# Patient Record
Sex: Female | Born: 1950 | Race: White | Hispanic: No | Marital: Married | Smoking: Never smoker
Health system: Southern US, Community
[De-identification: ages and names within clinical notes are randomized; demographics above are authoritative.]

## PROBLEM LIST (undated history)

## (undated) DIAGNOSIS — T4145XA Adverse effect of unspecified anesthetic, initial encounter: Secondary | ICD-10-CM

## (undated) DIAGNOSIS — C50919 Malignant neoplasm of unspecified site of unspecified female breast: Secondary | ICD-10-CM

## (undated) DIAGNOSIS — T8859XA Other complications of anesthesia, initial encounter: Secondary | ICD-10-CM

## (undated) DIAGNOSIS — R112 Nausea with vomiting, unspecified: Secondary | ICD-10-CM

## (undated) DIAGNOSIS — G4730 Sleep apnea, unspecified: Secondary | ICD-10-CM

## (undated) DIAGNOSIS — IMO0001 Reserved for inherently not codable concepts without codable children: Secondary | ICD-10-CM

## (undated) DIAGNOSIS — I10 Essential (primary) hypertension: Secondary | ICD-10-CM

## (undated) DIAGNOSIS — Z923 Personal history of irradiation: Secondary | ICD-10-CM

## (undated) DIAGNOSIS — M1990 Unspecified osteoarthritis, unspecified site: Secondary | ICD-10-CM

## (undated) DIAGNOSIS — R51 Headache: Secondary | ICD-10-CM

## (undated) DIAGNOSIS — R519 Headache, unspecified: Secondary | ICD-10-CM

## (undated) DIAGNOSIS — Z9889 Other specified postprocedural states: Secondary | ICD-10-CM

## (undated) HISTORY — DX: Unspecified osteoarthritis, unspecified site: M19.90

## (undated) HISTORY — PX: COLONOSCOPY W/ POLYPECTOMY: SHX1380

## (undated) HISTORY — DX: Essential (primary) hypertension: I10

## (undated) HISTORY — PX: APPENDECTOMY: SHX54

## (undated) HISTORY — PX: BREAST SURGERY: SHX581

## (undated) HISTORY — DX: Sleep apnea, unspecified: G47.30

## (undated) HISTORY — PX: JOINT REPLACEMENT: SHX530

## (undated) HISTORY — PX: OTHER SURGICAL HISTORY: SHX169

---

## 1999-07-19 ENCOUNTER — Other Ambulatory Visit: Admission: RE | Admit: 1999-07-19 | Discharge: 1999-07-19 | Payer: Self-pay | Admitting: Obstetrics and Gynecology

## 2000-09-30 ENCOUNTER — Other Ambulatory Visit: Admission: RE | Admit: 2000-09-30 | Discharge: 2000-09-30 | Payer: Self-pay | Admitting: Obstetrics and Gynecology

## 2001-10-13 ENCOUNTER — Other Ambulatory Visit: Admission: RE | Admit: 2001-10-13 | Discharge: 2001-10-13 | Payer: Self-pay | Admitting: Obstetrics and Gynecology

## 2002-10-22 ENCOUNTER — Emergency Department (HOSPITAL_COMMUNITY): Admission: EM | Admit: 2002-10-22 | Discharge: 2002-10-22 | Payer: Self-pay | Admitting: Emergency Medicine

## 2002-10-22 ENCOUNTER — Encounter: Payer: Self-pay | Admitting: Emergency Medicine

## 2003-08-29 ENCOUNTER — Other Ambulatory Visit: Admission: RE | Admit: 2003-08-29 | Discharge: 2003-08-29 | Payer: Self-pay | Admitting: Obstetrics and Gynecology

## 2005-04-26 ENCOUNTER — Encounter: Admission: RE | Admit: 2005-04-26 | Discharge: 2005-04-26 | Payer: Self-pay | Admitting: Family Medicine

## 2006-12-17 ENCOUNTER — Encounter: Admission: RE | Admit: 2006-12-17 | Discharge: 2006-12-17 | Payer: Self-pay | Admitting: Family Medicine

## 2007-12-09 ENCOUNTER — Other Ambulatory Visit: Admission: RE | Admit: 2007-12-09 | Discharge: 2007-12-09 | Payer: Self-pay | Admitting: Family Medicine

## 2009-06-13 ENCOUNTER — Other Ambulatory Visit: Admission: RE | Admit: 2009-06-13 | Discharge: 2009-06-13 | Payer: Self-pay | Admitting: Family Medicine

## 2010-07-03 ENCOUNTER — Other Ambulatory Visit: Admission: RE | Admit: 2010-07-03 | Discharge: 2010-07-03 | Payer: Self-pay | Admitting: Family Medicine

## 2013-01-13 ENCOUNTER — Other Ambulatory Visit: Payer: Self-pay | Admitting: Radiology

## 2013-01-13 DIAGNOSIS — C50919 Malignant neoplasm of unspecified site of unspecified female breast: Secondary | ICD-10-CM

## 2013-01-13 HISTORY — DX: Malignant neoplasm of unspecified site of unspecified female breast: C50.919

## 2013-01-14 ENCOUNTER — Other Ambulatory Visit: Payer: Self-pay | Admitting: Radiology

## 2013-01-14 DIAGNOSIS — C50911 Malignant neoplasm of unspecified site of right female breast: Secondary | ICD-10-CM

## 2013-01-18 ENCOUNTER — Telehealth: Payer: Self-pay | Admitting: *Deleted

## 2013-01-18 DIAGNOSIS — C50411 Malignant neoplasm of upper-outer quadrant of right female breast: Secondary | ICD-10-CM

## 2013-01-18 DIAGNOSIS — C50419 Malignant neoplasm of upper-outer quadrant of unspecified female breast: Secondary | ICD-10-CM | POA: Insufficient documentation

## 2013-01-18 NOTE — Telephone Encounter (Signed)
Confirmed [facility] for 01/27/13 at 1200.  Instructions and contact information given.

## 2013-01-25 ENCOUNTER — Ambulatory Visit
Admission: RE | Admit: 2013-01-25 | Discharge: 2013-01-25 | Disposition: A | Discharge status: Home / Self Care | Payer: BC Managed Care – PPO | Source: Ambulatory Visit | Attending: Radiology | Admitting: Radiology

## 2013-01-25 DIAGNOSIS — C50911 Malignant neoplasm of unspecified site of right female breast: Secondary | ICD-10-CM

## 2013-01-25 MED ORDER — GADOBENATE DIMEGLUMINE 529 MG/ML IV SOLN
20.0000 mL | Freq: Once | INTRAVENOUS | Status: AC | PRN
  Administered 2013-01-25: 20 mL via INTRAVENOUS

## 2013-01-27 ENCOUNTER — Encounter (INDEPENDENT_AMBULATORY_CARE_PROVIDER_SITE_OTHER): Payer: Self-pay | Admitting: General Surgery

## 2013-01-27 ENCOUNTER — Ambulatory Visit (HOSPITAL_BASED_OUTPATIENT_CLINIC_OR_DEPARTMENT_OTHER): Payer: BC Managed Care – PPO | Admitting: General Surgery

## 2013-01-27 ENCOUNTER — Ambulatory Visit: Payer: BC Managed Care – PPO | Attending: General Surgery | Admitting: Physical Therapy

## 2013-01-27 ENCOUNTER — Telehealth: Payer: Self-pay | Admitting: *Deleted

## 2013-01-27 ENCOUNTER — Ambulatory Visit (HOSPITAL_BASED_OUTPATIENT_CLINIC_OR_DEPARTMENT_OTHER): Payer: BC Managed Care – PPO | Admitting: Oncology

## 2013-01-27 ENCOUNTER — Other Ambulatory Visit (HOSPITAL_BASED_OUTPATIENT_CLINIC_OR_DEPARTMENT_OTHER): Payer: BC Managed Care – PPO

## 2013-01-27 ENCOUNTER — Encounter: Payer: Self-pay | Admitting: Oncology

## 2013-01-27 ENCOUNTER — Ambulatory Visit
Admission: RE | Admit: 2013-01-27 | Discharge: 2013-01-27 | Disposition: A | Discharge status: Home / Self Care | Payer: BC Managed Care – PPO | Source: Ambulatory Visit | Attending: Radiation Oncology | Admitting: Radiation Oncology

## 2013-01-27 ENCOUNTER — Encounter: Payer: Self-pay | Admitting: *Deleted

## 2013-01-27 ENCOUNTER — Ambulatory Visit: Payer: BC Managed Care – PPO

## 2013-01-27 VITALS — BP 170/81 | HR 91 | Temp 98.4°F | Resp 20 | Ht 67.0 in | Wt 225.9 lb

## 2013-01-27 DIAGNOSIS — C50411 Malignant neoplasm of upper-outer quadrant of right female breast: Secondary | ICD-10-CM

## 2013-01-27 DIAGNOSIS — C50419 Malignant neoplasm of upper-outer quadrant of unspecified female breast: Secondary | ICD-10-CM

## 2013-01-27 DIAGNOSIS — C50919 Malignant neoplasm of unspecified site of unspecified female breast: Secondary | ICD-10-CM | POA: Insufficient documentation

## 2013-01-27 DIAGNOSIS — IMO0001 Reserved for inherently not codable concepts without codable children: Secondary | ICD-10-CM | POA: Insufficient documentation

## 2013-01-27 DIAGNOSIS — M19049 Primary osteoarthritis, unspecified hand: Secondary | ICD-10-CM | POA: Insufficient documentation

## 2013-01-27 DIAGNOSIS — Z01818 Encounter for other preprocedural examination: Secondary | ICD-10-CM | POA: Insufficient documentation

## 2013-01-27 DIAGNOSIS — M1710 Unilateral primary osteoarthritis, unspecified knee: Secondary | ICD-10-CM | POA: Insufficient documentation

## 2013-01-27 DIAGNOSIS — R293 Abnormal posture: Secondary | ICD-10-CM | POA: Insufficient documentation

## 2013-01-27 DIAGNOSIS — IMO0002 Reserved for concepts with insufficient information to code with codable children: Secondary | ICD-10-CM | POA: Insufficient documentation

## 2013-01-27 LAB — COMPREHENSIVE METABOLIC PANEL (CC13)
ALT: 39 U/L (ref 0–55)
AST: 26 U/L (ref 5–34)
Alkaline Phosphatase: 115 U/L (ref 40–150)
BUN: 14.2 mg/dL (ref 7.0–26.0)
Calcium: 11.3 mg/dL — ABNORMAL HIGH (ref 8.4–10.4)
Chloride: 105 mEq/L (ref 98–107)
Creatinine: 0.8 mg/dL (ref 0.6–1.1)
Total Bilirubin: 0.47 mg/dL (ref 0.20–1.20)

## 2013-01-27 LAB — CBC WITH DIFFERENTIAL/PLATELET
BASO%: 0.5 % (ref 0.0–2.0)
Basophils Absolute: 0 10*3/uL (ref 0.0–0.1)
EOS%: 2.1 % (ref 0.0–7.0)
HCT: 41.8 % (ref 34.8–46.6)
HGB: 14.3 g/dL (ref 11.6–15.9)
LYMPH%: 29.5 % (ref 14.0–49.7)
MCH: 30.9 pg (ref 25.1–34.0)
MCHC: 34.2 g/dL (ref 31.5–36.0)
MCV: 90.3 fL (ref 79.5–101.0)
MONO%: 6 % (ref 0.0–14.0)
NEUT%: 61.9 % (ref 38.4–76.8)

## 2013-01-27 NOTE — Patient Instructions (Signed)
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 MRI shows this to be a solitary finding measuring 2.1 cm.  You are a good candidate for breast conservation, and have elected to proceed with right partial mastectomy with needle localization and right axillary sentinel node biopsy.  We will schedule the surgery at your convenience, and we will request that we do this as soon as possible.       Lumpectomy, Breast Conserving Surgery A lumpectomy is breast surgery that removes only part of the breast. Another name used may be partial mastectomy. The amount removed varies. Make sure you understand how much of your breast will be removed. Reasons for a lumpectomy:  Any solid breast mass.  Grouped significant nodularity that may be confused with a solitary breast mass. Lumpectomy is the most common form of breast cancer surgery today. The surgeon removes the portion of your breast which contains the tumor (cancer). This is the lump. Some normal tissue around the lump is also removed to be sure that all the tumor has been removed.  If cancer cells are found in the margins where the breast tissue was removed, your surgeon will do more surgery to remove the remaining cancer tissue. This is called re-excision surgery. Radiation and/or chemotherapy treatments are often given following a lumpectomy to kill any cancer cells that could possibly remain.  REASONS YOU MAY NOT BE ABLE TO HAVE BREAST CONSERVING SURGERY:  The tumor is located in more than one place.  Your breast is small and the tumor is large so the breast would be disfigured.  The entire tumor removal is not successful with a lumpectomy.  You cannot commit to a full course of chemotherapy, radiation therapy or are pregnant and cannot have radiation.  You have previously had radiation to the breast to treat cancer. HOW A LUMPECTOMY IS PERFORMED If overnight nursing is not  required following a biopsy, a lumpectomy can be performed as a same-day surgery. This can be done in a hospital, clinic, or surgical center. The anesthesia used will depend on your surgeon. They will discuss this with you. A general anesthetic keeps you sleeping through the procedure. LET YOUR CAREGIVERS KNOW ABOUT THE FOLLOWING:  Allergies  Medications taken including herbs, eye drops, over the counter medications, and creams.  Use of steroids (by mouth or creams)  Previous problems with anesthetics or Novocaine.  Possibility of pregnancy, if this applies  History of blood clots (thrombophlebitis)  History of bleeding or blood problems.  Previous surgery  Other health problems BEFORE THE PROCEDURE You should be present one hour prior to your procedure unless directed otherwise.  AFTER THE PROCEDURE  After surgery, you will be taken to the recovery area where a nurse will watch and check your progress. Once you're awake, stable, and taking fluids well, barring other problems you will be allowed to go home.  Ice packs applied to your operative site may help with discomfort and keep the swelling down.  A small rubber drain may be placed in the breast for a couple of days to prevent a hematoma from developing in the breast.  A pressure dressing may be applied for 24 to 48 hours to prevent bleeding.  Keep the wound dry.  You may resume a normal diet and activities as directed. Avoid strenuous activities affecting the arm on the side of the biopsy site such as tennis, swimming, heavy lifting (more than 10 pounds) or pulling.  Bruising in the breast is normal following this procedure.  Wearing a bra - even to bed - may be more comfortable and also help keep the dressing on.  Change dressings as directed.  Only take over-the-counter or prescription medicines for pain, discomfort, or fever as directed by your caregiver. Call for your results as instructed by your surgeon. Remember  it is your responsibility to get the results of your lumpectomy if your surgeon asked you to follow-up. Do not assume everything is fine if you have not heard from your caregiver. SEEK MEDICAL CARE IF:   There is increased bleeding (more than a small spot) from the wound.  You notice redness, swelling, or increasing pain in the wound.  Pus is coming from wound.  An unexplained oral temperature above 102 F (38.9 C) develops.  You notice a foul smell coming from the wound or dressing. SEEK IMMEDIATE MEDICAL CARE IF:   You develop a rash.  You have difficulty breathing.  You have any allergic problems. Document Released: 09/02/2006 Document Revised: 10/14/2011 Document Reviewed: 12/04/2006 [facility] Patient Information 2014 [place], [name].

## 2013-01-27 NOTE — Progress Notes (Signed)
Checked in new patient. No financial issues. She had her [facility] form. She does have POA/living but not with her. She wants communication via phone/mail.

## 2013-01-27 NOTE — Progress Notes (Signed)
Patient ID: Lori Randall, female   DOB: 21-Mar-1951, 62 y.o.   MRN: [id number]  No chief complaint on file.   HPI Lori Randall is a 62 y.o. female.  She is referred by Dr. Rogelia Mire at [facility] for evaluation of a newly diagnosed cancer of the right breast, upper outer quadrant. Dr. Maurice Small is her primary care physician. Dr. Theressa Millard manages her sleep apnea. She is being evaluated in the [facility] today by Dr. Darnelle Catalan, Dr. Mitzi Hansen, and me.   Her only prior breast issue was a right breast biopsy in 1970 for benign disease. Recent screening mammograms and US  show a small, 1.2 cm mass in the right breast, upper outer quadrant. Image guided biopsy shows invasive ductal carcinoma, PR 100%, PR 86%, HER-2 negative, he 6714%.  MRI shows a this is a solitary mass, but measures 2.1 x 1.3 x 1.2 cm by MRI criteria. This makes it a  clinical stage T2, N0.  We had lengthy discussion today. She is interested in breast conservation. She appears to be a good candidate for breast conservation, and radiation therapy for local control.  Comorbidities including sleep apnea, hyperlipidemia, obesity, hypertension, degenerative joint disease.  Family history is negative for breast or ovarian cancer. HPI  Past Medical History  Diagnosis Date  . Hypertension   . Arthritis   . Sleep apnea   . Glaucoma     Past Surgical History  Procedure Laterality Date  . Appendectomy    . Excisional biopsy right breast      History reviewed. No pertinent family history.  Social History History  Substance Use Topics  . Smoking status: Current Every Day Smoker  . Smokeless tobacco: Not on file  . Alcohol Use: Yes     Comment: occasional    Not on File  No current outpatient prescriptions on file.   No current facility-administered medications for this visit.    Review of Systems Review of Systems  Constitutional: Negative for fever, chills and unexpected weight change.  HENT: Negative for  hearing loss, congestion, sore throat, trouble swallowing and voice change.   Eyes: Negative for visual disturbance.  Respiratory: Negative for cough and wheezing.   Cardiovascular: Negative for chest pain, palpitations and leg swelling.  Gastrointestinal: Negative for nausea, vomiting, abdominal pain, diarrhea, constipation, blood in stool, abdominal distention and anal bleeding.  Genitourinary: Negative for hematuria, vaginal bleeding and difficulty urinating.  Musculoskeletal: Negative for arthralgias.  Skin: Negative for rash and wound.  Neurological: Negative for seizures, syncope and headaches.  Hematological: Negative for adenopathy. Does not bruise/bleed easily.  Psychiatric/Behavioral: Negative for confusion.    There were no vitals taken for this visit.  Physical Exam Physical Exam  Constitutional: She is oriented to person, place, and time. She appears well-developed and well-nourished. No distress.  HENT:  Head: Normocephalic and atraumatic.  Nose: Nose normal.  Mouth/Throat: No oropharyngeal exudate.  Eyes: Conjunctivae and EOM are normal. Pupils are equal, round, and reactive to light. Left eye exhibits no discharge. No scleral icterus.  Neck: Neck supple. No JVD present. No tracheal deviation present. No thyromegaly present.  Cardiovascular: Normal rate, regular rhythm, normal heart sounds and intact distal pulses.   No murmur heard. Pulmonary/Chest: Effort normal and breath sounds normal. No respiratory distress. She has no wheezes. She has no rales. She exhibits no tenderness.  Small ecchymoses and biopsy changes upper outer quadrant right breast. No adenopathy. No other mass or skin change.  Abdominal: Soft. Bowel sounds  are normal. She exhibits no distension and no mass. There is no tenderness. There is no rebound and no guarding.  Central obesity.RLQ scar.  Musculoskeletal: She exhibits no edema and no tenderness.  Lymphadenopathy:    She has no cervical  adenopathy.  Neurological: She is alert and oriented to person, place, and time. She exhibits normal muscle tone. Coordination normal.  Skin: Skin is warm. No rash noted. She is not diaphoretic. No erythema. No pallor.  Psychiatric: She has a normal mood and affect. Her behavior is normal. Judgment and thought content normal.    Data Reviewed Imaging studies, histology, but breast diagnostic profile, treatment plan  coordinated with Dr. Darnelle Catalan and Dr. Mitzi Hansen.  Assessment    Invasive ductal carcinoma right breast, upper outer quadrant, receptor positive, HER-2-negative, clinical stage TII, N0  Obstructive sleep apnea  Obesity  Hyperlipidemia  Hypertension     Plan    The patient will be scheduled for right partial mastectomy and right axillary sentinel lymph node biopsy.  I discussed the indications, details, techniques, and numerous risks of the surgery with her. She understands all these issues. All her questions were answered. She is aware of the possibilities of bleeding, infection, reoperation for positive margins, reoperation for positive nodes, cosmetic deformity, skin necrosis, and other unforeseen problems. She agrees with this plan.        Angelia Mould. Derrell Lolling, M.D., [facility] General and Minimally invasive Surgery Breast and Colorectal Surgery Office:   [phone] Pager:   [phone]  01/27/2013, 5:25 PM

## 2013-01-27 NOTE — Telephone Encounter (Signed)
appts made and printed...td 

## 2013-01-28 ENCOUNTER — Encounter (HOSPITAL_COMMUNITY): Payer: Self-pay | Admitting: Pharmacy Technician

## 2013-01-28 NOTE — Progress Notes (Signed)
ID: Lori Randall OB: [birth year]  MR#: [id number]  CSN#:[id number]  PCP: No primary provider on file. GYN:   SU:  OTHER MD: Maurice Small, Rogelia Mire, Gean Birchwood, Theressa Millard   HISTORY OF PRESENT ILLNESS: "Lori Randall" had routine screening mammography at [facility] 01/11/2013 showing a potential abnormality in the left breast. Additional views 01/13/2013 found that the calcifications noted in the left breast were likely benign. However on the right a previously noted mass was now irregular in contour. There were also some associated calcifications. A right breast ultrasound found a hypoechoic mass collar than wide measuring 1.2 cm. Biopsy of this mass the same day ([id number]) showed an invasive ductal carcinoma, grade 1, estrogen receptor 100% positive, progesterone receptor 86% positive, with an MIB-1 of 14% and no HER-2 amplification.  Bilateral breast MRIs 01/25/2013 showed a 2.1 cm lobulated enhancing mass in the posterior third of the upper outer quadrant of the right breast there were no other areas of concern in either breast and no enlarged axillary or internal mammary adenopathy.  The patient's subsequent history is as detailed below   INTERVAL HISTORY: Lori Randall was seen in the multidisciplinary breast cancer clinic 01/27/2013 accompanied by her husband Roe Coombs  REVIEW OF SYSTEMS: She tolerated the biopsy with unusual complications. There were no symptoms leading to the original screening mammogram, which was routine. She does not exercise regularly. She has a history of sleep apnea, and she uses CPAP every night. She has significant knee problems bilaterally. She has mild seasonal allergies. Otherwise a detailed review of systems today was noncontributory.  PAST MEDICAL HISTORY: Past Medical History  Diagnosis Date  . Hypertension   . Arthritis   . Sleep apnea   . Glaucoma     PAST SURGICAL HISTORY: Past Surgical History  Procedure Laterality Date  . Appendectomy    . Excisional  biopsy right breast      FAMILY HISTORY No family history on file. The patient's father died at the age of 31 with congestive 4 [name] in the setting of severe dementia. The patient's mother died at age 37 with atypical parkinsonism. The patient has 3 brothers and 2 sisters. There is no history of breast or ovarian cancer in the family  GYNECOLOGIC HISTORY:  Menarche age 12, first live birth age 6. The patient is GX P2. She went through menopause approximately 2004. She took hormone replacement approximately 2 years. She took birth control remotely for approximately 10 years, without complications.  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 Her husband Gaye Pollack") M. Multimedia programmer used to work for [employer]. He is now retired. Daughter Twanda Stakes is a paramedic in [facility] [employer]. Son Neah Sporrer is a landscaper in [place]. The patient has no grandchildren. She attends a [employer]    ADVANCED DIRECTIVES: In place.   HEALTH MAINTENANCE: History  Substance Use Topics  . Smoking status: Current Every Day Smoker  . Smokeless tobacco: Not on file  . Alcohol Use: Yes     Comment: occasional     Colonoscopy: 2005  PAP: 2012  Bone density:  Lipid panel:  Not on File  No current outpatient prescriptions on file.   No current facility-administered medications for this visit.    OBJECTIVE: Middle-aged white woman in no acute distress Filed Vitals:   01/27/13 1233  BP: 170/81  Pulse: 91  Temp: 98.4 F (36.9 C)  Resp: 20     Body mass index is 35.37 kg/(m^2).  ECOG FS: 0  Sclerae unicteric Oropharynx clear No cervical or supraclavicular adenopathy Lungs no rales or rhonchi Heart regular rate and rhythm Abd obese, benign MSK no focal spinal tenderness, no peripheral edema Neuro: non-focal, well-oriented, appropriate affect Breasts: The right breast is status post recent biopsy. There is no skin or nipple change of concern. The right  axilla is benign. The left breast is unremarkable.   LAB RESULTS:  CMP     Component Value Date/Time   NA 142 01/27/2013 1208   K 3.8 01/27/2013 1208   CL 105 01/27/2013 1208   CO2 27 01/27/2013 1208   GLUCOSE 123* 01/27/2013 1208   BUN 14.2 01/27/2013 1208   CREATININE 0.8 01/27/2013 1208   CALCIUM 11.3* 01/27/2013 1208   PROT 7.5 01/27/2013 1208   ALBUMIN 4.1 01/27/2013 1208   AST 26 01/27/2013 1208   ALT 39 01/27/2013 1208   ALKPHOS 115 01/27/2013 1208   BILITOT 0.47 01/27/2013 1208    I No results found for this basename: SPEP, UPEP,  kappa and lambda light chains    Lab Results  Component Value Date   WBC 5.7 01/27/2013   NEUTROABS 3.5 01/27/2013   HGB 14.3 01/27/2013   HCT 41.8 01/27/2013   MCV 90.3 01/27/2013   PLT 231 01/27/2013      Chemistry      Component Value Date/Time   NA 142 01/27/2013 1208   K 3.8 01/27/2013 1208   CL 105 01/27/2013 1208   CO2 27 01/27/2013 1208   BUN 14.2 01/27/2013 1208   CREATININE 0.8 01/27/2013 1208      Component Value Date/Time   CALCIUM 11.3* 01/27/2013 1208   ALKPHOS 115 01/27/2013 1208   AST 26 01/27/2013 1208   ALT 39 01/27/2013 1208   BILITOT 0.47 01/27/2013 1208       No results found for this basename: LABCA2    No components found with this basename: LABCA125    No results found for this basename: INR,  in the last 168 hours  Urinalysis No results found for this basename: colorurine, appearanceur, labspec, phurine, glucoseu, hgbur, bilirubinur, ketonesur, proteinur, urobilinogen, nitrite, leukocytesur    STUDIES: Mr Breast Bilateral W Wo Contrast  01/25/2013   **ADDENDUM** CREATED: 01/25/2013 13:10:04  BUN and creatinine were obtained on site at [facility] at [address] Results:  BUN 11 mg/dL,  Creatinine 0.7 mg/dL.  Addended by:  Littie Deeds. Judyann Munson, M.D. on 01/25/2013 13:10:04.  **END ADDENDUM** SIGNED BY: Dina L. Judyann Munson, M.D.  01/25/2013   *RADIOLOGY REPORT*  Clinical Data: Biopsy-proven invasive ductal carcinoma  in the 10 o'clock region of the right breast.  BILATERAL BREAST MRI WITH AND WITHOUT CONTRAST  Technique: Multiplanar, multisequence MR images of both breasts were obtained prior to and following the intravenous administration of 20ml of multihance.  Three dimensional images were evaluated at the independent DynaCad workstation.  Comparison:  Mammograms dated 01/13/2013, 01/11/2013, 12/17/2011 and 12/12/2010 from [facility].  Findings: There is a mild background parenchymal enhancement pattern.   In the posterior third of the upper outer quadrant of the right breast there is a 2.1 x 1.3 x 1.2 cm lobulated, enhancing mass.  It is associated with post-biopsy changes and a signal void artifact from the clip placement.  No abnormal enhancement is seen in the left breast.  There is no enlarged axillary or internal mammary adenopathy.  IMPRESSION: Solitary enhancing mass in the upper outer quadrant of the right breast corresponding well with  the known malignancy.  RECOMMENDATION: Treatment planning of the right breast is recommended.  THREE-DIMENSIONAL MR IMAGE RENDERING ON INDEPENDENT WORKSTATION:  Three-dimensional MR images were rendered by post-processing of the original MR data on an independent workstation.  The three- dimensional MR images were interpreted, and findings were reported in the accompanying complete MRI report for this study.  BI-RADS CATEGORY 6:  Known biopsy-proven malignancy - appropriate action should be taken.   Original Report Authenticated By: Baird Lyons, M.D.    ASSESSMENT: 62 y.o. [place], [place] woman status post right breast biopsy 01/13/2013 for a clinical T2 N0, stage IIA invasive ductal carcinoma, grade 1, estrogen receptor 100% positive, progesterone receptor 86% positive, with an MIB-1 of 14% and no HER-2 amplification  PLAN: We spent the better part of today's hour-long visit discussing the biology of breast cancer and the specifics of Cathy's situation. She is an  excellent candidate for breast conservation and she understands the survival results for lumpectomy with sentinel lymph node sampling and radiation are equivalent to those of mastectomy with full axillary lymph node dissection. She will benefit from post lumpectomy radiation and we'll so discuss antiestrogen therapy.  The question of chemotherapy is more complex. Risk reduction in this low-grade tumor with a low proliferation is likely to be marginal even with aggressive chemotherapy. Accordingly we are requesting an Oncotype to be sent and I will see the patient again a little over 2 weeks after surgery to discuss those results. I am hopeful the results will be favorable and the patient may be able to avoid chemotherapy.  Lori Randall has a good understanding of the overall planned, and all this information was given to her in writing. She will see me again late July. She knows to call for any problems that may develop before that visit. Lowella Dell, MD   01/28/2013 8:26 AM

## 2013-01-29 ENCOUNTER — Encounter (HOSPITAL_COMMUNITY): Payer: Self-pay | Admitting: *Deleted

## 2013-01-29 NOTE — Progress Notes (Addendum)
Pt. Doesn't have a cardiologist. denies ever having a stress test/heart cath/echo.  Pt. Denies having glaucoma.

## 2013-01-29 NOTE — H&P (Signed)
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 female.  She is referred by Dr. Rogelia Mire at [facility] for evaluation of a newly diagnosed cancer of the right breast, upper outer quadrant. Dr. Maurice Small is her primary care physician. Dr. Theressa Millard manages her sleep apnea. She is being evaluated in the [facility] today by Dr. Darnelle Catalan, Dr. Mitzi Hansen, and me.    Her only prior breast issue was a right breast biopsy in 1970 for benign disease. Recent screening mammograms and US  show a small, 1.2 cm mass in the right breast, upper outer quadrant. Image guided biopsy shows invasive ductal carcinoma, PR 100%, PR 86%, HER-2 negative, he 6714%.   MRI shows a this is a solitary mass, but measures 2.1 x 1.3 x 1.2 cm by MRI criteria. This makes it a  clinical stage T2, N0.   We had lengthy discussion today. She is interested in breast conservation. She appears to be a good candidate for breast conservation, and radiation therapy for local control.   Comorbidities including sleep apnea, hyperlipidemia, obesity, hypertension, degenerative joint disease.   Family history is negative for breast or ovarian cancer.       Past Medical History   Diagnosis  Date   .  Hypertension     .  Arthritis     .  Sleep apnea     .  Glaucoma           Past Surgical History   Procedure  Laterality  Date   .  Appendectomy       .  Excisional biopsy right breast          History reviewed. No pertinent family history.   Social History History   Substance Use Topics   .  Smoking status:  Current Every Day Smoker   .  Smokeless tobacco:  Not on file   .   Alcohol Use:  Yes         Comment: occasional       Review of Systems  Constitutional: Negative for fever, chills and unexpected weight change.  HENT: Negative for hearing loss, congestion, sore throat, trouble swallowing and voice change.   Eyes: Negative for visual disturbance.  Respiratory: Negative for cough and wheezing.   Cardiovascular: Negative for chest pain, palpitations and leg swelling.  Gastrointestinal: Negative for nausea, vomiting, abdominal pain, diarrhea, constipation, blood in stool, abdominal distention and anal bleeding.  Genitourinary: Negative for hematuria, vaginal bleeding and difficulty urinating.  Musculoskeletal: Negative for arthralgias.  Skin: Negative for rash and wound.  Neurological: Negative for seizures, syncope and headaches.  Hematological: Negative for adenopathy. Does not bruise/bleed easily.  Psychiatric/Behavioral: Negative for confusion.       Physical Exam  Constitutional: She is oriented to person, place, and time. She appears well-developed and well-nourished. No distress.  HENT:   Head: Normocephalic and atraumatic.  Nose: Nose normal.   Mouth/Throat: No oropharyngeal exudate.  Eyes: Conjunctivae and EOM are normal. Pupils are equal, round, and reactive to light. Left eye exhibits no discharge. No scleral icterus.  Neck: Neck supple. No JVD present. No tracheal deviation present. No thyromegaly present.  Cardiovascular: Normal rate, regular rhythm, normal heart sounds and intact distal pulses.    No murmur heard. Pulmonary/Chest: Effort normal and breath sounds normal. No respiratory distress. She has no wheezes. She has no rales. She exhibits no tenderness.  Small ecchymoses and biopsy changes upper outer quadrant right breast. No adenopathy. No other mass or skin change.  Abdominal: Soft. Bowel sounds are normal. She exhibits no distension and no mass. There is no tenderness. There is no rebound and no guarding.  Central  obesity.RLQ scar.  Musculoskeletal: She exhibits no edema and no tenderness.  Lymphadenopathy:    She has no cervical adenopathy.  Neurological: She is alert and oriented to person, place, and time. She exhibits normal muscle tone. Coordination normal.  Skin: Skin is warm. No rash noted. She is not diaphoretic. No erythema. No pallor.  Psychiatric: She has a normal mood and affect. Her behavior is normal. Judgment and thought content normal.      Data Reviewed Imaging studies, histology, but breast diagnostic profile, treatment plan  coordinated with Dr. Darnelle Catalan and Dr. Mitzi Hansen.   Assessment    Invasive ductal carcinoma right breast, upper outer quadrant, receptor positive, HER-2-negative, clinical stage TII, N0   Obstructive sleep apnea   Obesity   Hyperlipidemia   Hypertension      Plan    The patient will be scheduled for right partial mastectomy and right axillary sentinel lymph node biopsy.   I discussed the indications, details, techniques, and numerous risks of the surgery with her. She understands all these issues. All her questions were answered. She is aware of the possibilities of bleeding, infection, reoperation for positive margins, reoperation for positive nodes, cosmetic deformity, skin necrosis, and other unforeseen problems. She agrees with this plan.         Angelia Mould. Derrell Lolling, M.D., [facility] General and Minimally invasive Surgery Breast and Colorectal Surgery Office:   [phone] Pager:   [phone]

## 2013-01-31 MED ORDER — CEFAZOLIN SODIUM-DEXTROSE 2-3 GM-% IV SOLR
2.0000 g | INTRAVENOUS | Status: AC
  Administered 2013-02-01: 2 g via INTRAVENOUS
  Filled 2013-01-31: qty 50

## 2013-02-01 ENCOUNTER — Telehealth (INDEPENDENT_AMBULATORY_CARE_PROVIDER_SITE_OTHER): Payer: Self-pay | Admitting: General Surgery

## 2013-02-01 ENCOUNTER — Encounter (HOSPITAL_COMMUNITY)
Admission: RE | Disposition: A | Discharge status: Home / Self Care | Payer: Self-pay | Source: Ambulatory Visit | Attending: General Surgery

## 2013-02-01 ENCOUNTER — Encounter (HOSPITAL_COMMUNITY)
Admission: RE | Admit: 2013-02-01 | Discharge: 2013-02-01 | Disposition: A | Discharge status: Home / Self Care | Payer: BC Managed Care – PPO | Source: Ambulatory Visit | Attending: General Surgery | Admitting: General Surgery

## 2013-02-01 ENCOUNTER — Ambulatory Visit (HOSPITAL_COMMUNITY)
Admission: RE | Admit: 2013-02-01 | Discharge: 2013-02-01 | Disposition: A | Discharge status: Home / Self Care | Payer: BC Managed Care – PPO | Source: Ambulatory Visit | Attending: General Surgery | Admitting: General Surgery

## 2013-02-01 ENCOUNTER — Ambulatory Visit (HOSPITAL_COMMUNITY): Payer: BC Managed Care – PPO

## 2013-02-01 ENCOUNTER — Telehealth: Payer: Self-pay | Admitting: *Deleted

## 2013-02-01 ENCOUNTER — Encounter (HOSPITAL_COMMUNITY): Payer: Self-pay | Admitting: Certified Registered"

## 2013-02-01 ENCOUNTER — Ambulatory Visit (HOSPITAL_COMMUNITY): Payer: BC Managed Care – PPO | Admitting: Certified Registered"

## 2013-02-01 ENCOUNTER — Ambulatory Visit (HOSPITAL_BASED_OUTPATIENT_CLINIC_OR_DEPARTMENT_OTHER): Admit: 2013-02-01 | Payer: Self-pay | Admitting: General Surgery

## 2013-02-01 ENCOUNTER — Encounter (HOSPITAL_BASED_OUTPATIENT_CLINIC_OR_DEPARTMENT_OTHER): Payer: Self-pay

## 2013-02-01 DIAGNOSIS — F17200 Nicotine dependence, unspecified, uncomplicated: Secondary | ICD-10-CM | POA: Insufficient documentation

## 2013-02-01 DIAGNOSIS — C50411 Malignant neoplasm of upper-outer quadrant of right female breast: Secondary | ICD-10-CM

## 2013-02-01 DIAGNOSIS — Z170 Estrogen receptor positive status [ER+]: Secondary | ICD-10-CM | POA: Insufficient documentation

## 2013-02-01 DIAGNOSIS — I10 Essential (primary) hypertension: Secondary | ICD-10-CM | POA: Insufficient documentation

## 2013-02-01 DIAGNOSIS — H409 Unspecified glaucoma: Secondary | ICD-10-CM | POA: Insufficient documentation

## 2013-02-01 DIAGNOSIS — C50419 Malignant neoplasm of upper-outer quadrant of unspecified female breast: Secondary | ICD-10-CM | POA: Diagnosis present

## 2013-02-01 DIAGNOSIS — G4730 Sleep apnea, unspecified: Secondary | ICD-10-CM | POA: Insufficient documentation

## 2013-02-01 DIAGNOSIS — E669 Obesity, unspecified: Secondary | ICD-10-CM | POA: Insufficient documentation

## 2013-02-01 DIAGNOSIS — M1990 Unspecified osteoarthritis, unspecified site: Secondary | ICD-10-CM | POA: Insufficient documentation

## 2013-02-01 DIAGNOSIS — C50919 Malignant neoplasm of unspecified site of unspecified female breast: Secondary | ICD-10-CM

## 2013-02-01 DIAGNOSIS — E785 Hyperlipidemia, unspecified: Secondary | ICD-10-CM | POA: Insufficient documentation

## 2013-02-01 HISTORY — PX: PARTIAL MASTECTOMY WITH NEEDLE LOCALIZATION AND AXILLARY SENTINEL LYMPH NODE BX: SHX6009

## 2013-02-01 LAB — URINALYSIS, ROUTINE W REFLEX MICROSCOPIC
Glucose, UA: NEGATIVE mg/dL
Leukocytes, UA: NEGATIVE
pH: 6 (ref 5.0–8.0)

## 2013-02-01 LAB — CBC WITH DIFFERENTIAL/PLATELET
Basophils Absolute: 0 10*3/uL (ref 0.0–0.1)
HCT: 40.6 % (ref 36.0–46.0)
Hemoglobin: 13.9 g/dL (ref 12.0–15.0)
Lymphocytes Relative: 31 % (ref 12–46)
Monocytes Absolute: 0.3 10*3/uL (ref 0.1–1.0)
Monocytes Relative: 6 % (ref 3–12)
Neutro Abs: 2.8 10*3/uL (ref 1.7–7.7)
RDW: 12.8 % (ref 11.5–15.5)
WBC: 4.6 10*3/uL (ref 4.0–10.5)

## 2013-02-01 LAB — COMPREHENSIVE METABOLIC PANEL
Albumin: 4.3 g/dL (ref 3.5–5.2)
BUN: 13 mg/dL (ref 6–23)
Creatinine, Ser: 0.61 mg/dL (ref 0.50–1.10)
Total Protein: 7.4 g/dL (ref 6.0–8.3)

## 2013-02-01 LAB — URINE MICROSCOPIC-ADD ON

## 2013-02-01 SURGERY — PARTIAL MASTECTOMY WITH NEEDLE LOCALIZATION AND AXILLARY SENTINEL LYMPH NODE BX
Anesthesia: General | Site: Breast | Laterality: Right

## 2013-02-01 MED ORDER — BUPIVACAINE-EPINEPHRINE 0.5% -1:200000 IJ SOLN
INTRAMUSCULAR | Status: DC | PRN
  Administered 2013-02-01: 18 mL

## 2013-02-01 MED ORDER — HYDROMORPHONE HCL PF 1 MG/ML IJ SOLN
INTRAMUSCULAR | Status: AC
  Filled 2013-02-01: qty 1

## 2013-02-01 MED ORDER — LIDOCAINE HCL (CARDIAC) 20 MG/ML IV SOLN
INTRAVENOUS | Status: DC | PRN
  Administered 2013-02-01: 60 mg via INTRAVENOUS

## 2013-02-01 MED ORDER — MIDAZOLAM HCL 5 MG/5ML IJ SOLN
INTRAMUSCULAR | Status: DC | PRN
  Administered 2013-02-01: 2 mg via INTRAVENOUS

## 2013-02-01 MED ORDER — OXYCODONE HCL 5 MG PO TABS: 5.0000 mg | ORAL_TABLET | ORAL | Status: DC | PRN

## 2013-02-01 MED ORDER — MUPIROCIN 2 % EX OINT
TOPICAL_OINTMENT | CUTANEOUS | Status: AC
  Filled 2013-02-01: qty 22

## 2013-02-01 MED ORDER — KETOROLAC TROMETHAMINE 30 MG/ML IJ SOLN
INTRAMUSCULAR | Status: AC
  Filled 2013-02-01: qty 1

## 2013-02-01 MED ORDER — LACTATED RINGERS IV SOLN
INTRAVENOUS | Status: DC | PRN
  Administered 2013-02-01: 11:00:00 via INTRAVENOUS

## 2013-02-01 MED ORDER — MUPIROCIN 2 % EX OINT: TOPICAL_OINTMENT | Freq: Two times a day (BID) | CUTANEOUS | Status: DC

## 2013-02-01 MED ORDER — PROPOFOL 10 MG/ML IV BOLUS
INTRAVENOUS | Status: DC | PRN
  Administered 2013-02-01: 200 mg via INTRAVENOUS

## 2013-02-01 MED ORDER — METHYLENE BLUE 1 % INJ SOLN
INTRAMUSCULAR | Status: AC
  Filled 2013-02-01: qty 10

## 2013-02-01 MED ORDER — KETOROLAC TROMETHAMINE 30 MG/ML IJ SOLN
30.0000 mg | Freq: Once | INTRAMUSCULAR | Status: AC
  Administered 2013-02-01: 30 mg via INTRAVENOUS

## 2013-02-01 MED ORDER — TECHNETIUM TC 99M SULFUR COLLOID FILTERED
1.0000 | Freq: Once | INTRAVENOUS | Status: AC | PRN
  Administered 2013-02-01: 1 via INTRADERMAL

## 2013-02-01 MED ORDER — METHYLENE BLUE 1 % INJ SOLN
INTRAMUSCULAR | Status: DC | PRN
  Administered 2013-02-01: 5 mL via SUBMUCOSAL

## 2013-02-01 MED ORDER — FENTANYL CITRATE 0.05 MG/ML IJ SOLN
INTRAMUSCULAR | Status: DC | PRN
  Administered 2013-02-01 (×4): 25 ug via INTRAVENOUS
  Administered 2013-02-01: 50 ug via INTRAVENOUS

## 2013-02-01 MED ORDER — ONDANSETRON HCL 4 MG/2ML IJ SOLN
INTRAMUSCULAR | Status: DC | PRN
  Administered 2013-02-01: 4 mg via INTRAVENOUS

## 2013-02-01 MED ORDER — LIDOCAINE-EPINEPHRINE (PF) 1 %-1:200000 IJ SOLN
INTRAMUSCULAR | Status: AC
  Filled 2013-02-01: qty 10

## 2013-02-01 MED ORDER — CHLORHEXIDINE GLUCONATE 4 % EX LIQD: 1.0000 "application " | Freq: Once | CUTANEOUS | Status: DC

## 2013-02-01 MED ORDER — HYDROMORPHONE HCL PF 1 MG/ML IJ SOLN
0.2500 mg | INTRAMUSCULAR | Status: DC | PRN
  Administered 2013-02-01 (×2): 0.5 mg via INTRAVENOUS

## 2013-02-01 MED ORDER — BUPIVACAINE-EPINEPHRINE (PF) 0.5% -1:200000 IJ SOLN
INTRAMUSCULAR | Status: AC
  Filled 2013-02-01: qty 10

## 2013-02-01 MED ORDER — HYDROCODONE-ACETAMINOPHEN 5-325 MG PO TABS
1.0000 | ORAL_TABLET | ORAL | Status: DC | PRN
Start: 2013-02-01 — End: 2013-05-20

## 2013-02-01 SURGICAL SUPPLY — 57 items
ADH SKN CLS APL DERMABOND .7 (GAUZE/BANDAGES/DRESSINGS) ×1
APL SKNCLS STERI-STRIP NONHPOA (GAUZE/BANDAGES/DRESSINGS) ×1
APPLIER CLIP 9.375 MED OPEN (MISCELLANEOUS) ×2
APR CLP MED 9.3 20 MLT OPN (MISCELLANEOUS) ×1
BENZOIN TINCTURE PRP APPL 2/3 (GAUZE/BANDAGES/DRESSINGS) ×2 IMPLANT
BINDER BREAST LRG (GAUZE/BANDAGES/DRESSINGS) IMPLANT
BINDER BREAST XLRG (GAUZE/BANDAGES/DRESSINGS) ×1 IMPLANT
BLADE SURG ROTATE 9660 (MISCELLANEOUS) IMPLANT
CANISTER SUCTION 2500CC (MISCELLANEOUS) ×2 IMPLANT
CHLORAPREP W/TINT 26ML (MISCELLANEOUS) ×2 IMPLANT
CLIP APPLIE 9.375 MED OPEN (MISCELLANEOUS) ×1 IMPLANT
CLOTH BEACON ORANGE TIMEOUT ST (SAFETY) ×2 IMPLANT
CONT SPEC 4OZ CLIKSEAL STRL BL (MISCELLANEOUS) ×2 IMPLANT
COVER PROBE W GEL 5X96 (DRAPES) ×2 IMPLANT
COVER SURGICAL LIGHT HANDLE (MISCELLANEOUS) ×2 IMPLANT
DECANTER SPIKE VIAL GLASS SM (MISCELLANEOUS) ×2 IMPLANT
DERMABOND ADVANCED (GAUZE/BANDAGES/DRESSINGS) ×1
DERMABOND ADVANCED .7 DNX12 (GAUZE/BANDAGES/DRESSINGS) ×1 IMPLANT
DEVICE DUBIN SPECIMEN MAMMOGRA (MISCELLANEOUS) ×2 IMPLANT
DRAIN CHANNEL 19F RND (DRAIN) IMPLANT
DRAPE LAPAROSCOPIC ABDOMINAL (DRAPES) ×2 IMPLANT
DRAPE UTILITY 15X26 W/TAPE STR (DRAPE) ×4 IMPLANT
DRSG PAD ABDOMINAL 8X10 ST (GAUZE/BANDAGES/DRESSINGS) ×1 IMPLANT
ELECT CAUTERY BLADE 6.4 (BLADE) ×2 IMPLANT
ELECT REM PT RETURN 9FT ADLT (ELECTROSURGICAL) ×2
ELECTRODE REM PT RTRN 9FT ADLT (ELECTROSURGICAL) ×1 IMPLANT
EVACUATOR SILICONE 100CC (DRAIN) IMPLANT
GLOVE BIOGEL PI IND STRL 6.5 (GLOVE) IMPLANT
GLOVE BIOGEL PI INDICATOR 6.5 (GLOVE) ×1
GLOVE ECLIPSE 6.5 STRL STRAW (GLOVE) ×1 IMPLANT
GLOVE EUDERMIC 7 POWDERFREE (GLOVE) ×2 IMPLANT
GOWN STRL NON-REIN LRG LVL3 (GOWN DISPOSABLE) ×2 IMPLANT
GOWN STRL REIN XL XLG (GOWN DISPOSABLE) ×2 IMPLANT
KIT BASIN OR (CUSTOM PROCEDURE TRAY) ×2 IMPLANT
KIT MARKER MARGIN INK (KITS) ×2 IMPLANT
KIT ROOM TURNOVER OR (KITS) ×2 IMPLANT
NDL 18GX1X1/2 (RX/OR ONLY) (NEEDLE) ×1 IMPLANT
NDL HYPO 25GX1X1/2 BEV (NEEDLE) ×2 IMPLANT
NEEDLE 18GX1X1/2 (RX/OR ONLY) (NEEDLE) ×2 IMPLANT
NEEDLE HYPO 25GX1X1/2 BEV (NEEDLE) ×4 IMPLANT
NS IRRIG 1000ML POUR BTL (IV SOLUTION) ×2 IMPLANT
PACK GENERAL/GYN (CUSTOM PROCEDURE TRAY) ×2 IMPLANT
PAD ARMBOARD 7.5X6 YLW CONV (MISCELLANEOUS) ×2 IMPLANT
SPONGE GAUZE 4X4 12PLY (GAUZE/BANDAGES/DRESSINGS) ×2 IMPLANT
SPONGE LAP 4X18 X RAY DECT (DISPOSABLE) ×2 IMPLANT
STAPLER VISISTAT 35W (STAPLE) IMPLANT
STRIP CLOSURE SKIN 1/2X4 (GAUZE/BANDAGES/DRESSINGS) ×2 IMPLANT
SUT ETHILON 3 0 FSL (SUTURE) IMPLANT
SUT MNCRL AB 4-0 PS2 18 (SUTURE) ×3 IMPLANT
SUT SILK 2 0 SH (SUTURE) ×2 IMPLANT
SUT VIC AB 3-0 SH 18 (SUTURE) ×3 IMPLANT
SUT VIC AB 3-0 SH 27 (SUTURE) ×2
SUT VIC AB 3-0 SH 27XBRD (SUTURE) ×1 IMPLANT
SUT VICRYL AB 3 0 TIES (SUTURE) IMPLANT
SYR CONTROL 10ML LL (SYRINGE) ×4 IMPLANT
TOWEL OR 17X24 6PK STRL BLUE (TOWEL DISPOSABLE) ×2 IMPLANT
TOWEL OR 17X26 10 PK STRL BLUE (TOWEL DISPOSABLE) ×2 IMPLANT

## 2013-02-01 NOTE — Anesthesia Procedure Notes (Signed)
Procedure Name: LMA Insertion Date/Time: 02/01/2013 11:02 AM Performed by: Jerilee Hoh Pre-anesthesia Checklist: Patient identified, Emergency Drugs available, Suction available and Patient being monitored Patient Re-evaluated:Patient Re-evaluated prior to inductionOxygen Delivery Method: Circle system utilized Preoxygenation: Pre-oxygenation with 100% oxygen Intubation Type: IV induction LMA: LMA inserted LMA Size: 4.0 Number of attempts: 1 Placement Confirmation: positive ETCO2 and breath sounds checked- equal and bilateral Tube secured with: Tape Dental Injury: Teeth and Oropharynx as per pre-operative assessment

## 2013-02-01 NOTE — Telephone Encounter (Signed)
Left message for a return phone call from [facility] 01/27/13.

## 2013-02-01 NOTE — Op Note (Signed)
Patient Name:           Lori Randall   Date of Surgery:        02/01/2013  Pre op Diagnosis:      Invasive ductal carcinoma right breast, upper outer quadrant, receptor positive, HER-2-negative, clinical stage T2, N0   Post op Diagnosis:    Same  Procedure:                 Inject blue dye right breast, right partial mastectomy with needle localization, right axillary sentinel node biopsy  Surgeon:                     Angelia Mould. Derrell Lolling, M.D., FACS  Assistant:                      None  Operative Indications:   Lori Randall is a 62 y.o. female. She is referred by Dr. Rogelia Mire at [facility] for evaluation of a newly diagnosed cancer of the right breast, upper outer quadrant. Dr. Maurice Small is her primary care physician. Dr. Theressa Millard manages her sleep apnea. She was evaluated in the [facility] last week by Dr. Darnelle Catalan, Dr. Mitzi Hansen, and me.  Her only prior breast issue was a right breast biopsy in 1970 for benign disease. Recent screening mammograms and US show a small, 1.2 cm mass in the right breast, upper outer quadrant. Image guided biopsy shows invasive ductal carcinoma, PR 100%, PR 86%, HER-2 negative, he 6714%.  MRI shows a this is a solitary mass, but measures 2.1 x 1.3 x 1.2 cm by MRI criteria. This makes it a clinical stage T2, N0.  We had lengthy discussion . She is interested in breast conservation. She appears to be a good candidate for breast conservation, and radiation therapy for local control.  Comorbidities including sleep apnea, hyperlipidemia, obesity, hypertension, degenerative joint disease.  Family history is negative for breast or ovarian cancer.   Operative Findings:       The patient actually had 2 wires placed. The first wire was placed from the lateral position approximately 9:00 position, but it pulled back and was too superficial. The radiologist then placed   a second wire from the superior position, 11:30 position and this went through the mass and went a  little bit beyond it. I was able to remove both wires with the lumpectomy. A specimen mammogram looked good with the marker clip and the calcifications in the specimen. I found 5 sentinel lymph nodes.  Procedure in Detail:          Following the wire localization the patient was brought to the holding area at [facility] hospital. The patient's right breast was injected with the radionuclide by the nuclear medicine technician. The patient was taken the operating room where general anesthesia was induced. Surgical time out was performed. Following alcohol prep the right breast was injected with 5 cc of blue dye, 2 cc of methylene blue mixed with 3 cc of saline. The breast was massaged. Intravenous antibiotics were given. The right breast in the right axilla and shoulder were prepped and draped in a sterile fashion. 0.5% Marcaine with epinephrine was used as local infiltration anesthetic.  A curvilinear incision was made in the upper outer quadrant of the right breast. Dissection was carried down into the breast tissue and I carefully dissected around the localizing wires. The specimen was removed and marked with silk sutures and the 6 color ink kit.  Specimen mammogram looked good and the cancer appeared to be adequately excised. Specimen was sent to the lab. Hemostasis was excellent and achieved with electrocautery. The wound was irrigated with saline. Metallic clips were placed in the lumpectomy cavity to orient the radiation oncologist. The breast tissues were closed in several layers with interrupted sutures of 3-0 Vicryl and the skin was closed with a running subcuticular suture of 4-0 Monocryl and Dermabond.  Attention was directed to the right axilla. The Neoprobe was used to localize the sentinel nodes. A transverse incision was made at the hairline. Dissection was carried down through the subcutaneous tissue. The clavipectoral fascia was incised. I enter the axillary space and found  5 sentinel lymph nodes.  They all had very hot and blue characteristics. After all 5 nodes were removed there was very little radioactivity. Hemostasis was excellent and achieved with electrocautery. It was irrigated with saline. The deeper tissues were closed in 2 separate layers with interrupted sutures of 3-0 Vicryl and the skin was then closed with a running subcuticular suture of 4-0 Monocryl and Dermabond. A breast binder was placed and the patient taken to recovery in stable condition. EBL 25 cc. Counts correct. Complications none.     Angelia Mould. Derrell Lolling, M.D., FACS General and Minimally Invasive Surgery Breast and Colorectal Surgery  02/01/2013 12:32 PM

## 2013-02-01 NOTE — Anesthesia Preprocedure Evaluation (Addendum)
Anesthesia Evaluation  Patient identified by MRN, date of birth, ID band Patient awake    Reviewed: Allergy & Precautions, H&P , Patient's Chart, lab work & pertinent test results  Airway Mallampati: II      Dental   Pulmonary sleep apnea ,  breath sounds clear to auscultation        Cardiovascular hypertension, Rhythm:Regular Rate:Normal     Neuro/Psych    GI/Hepatic negative GI ROS, Neg liver ROS,   Endo/Other  negative endocrine ROS  Renal/GU negative Renal ROS     Musculoskeletal   Abdominal   Peds  Hematology   Anesthesia Other Findings   Reproductive/Obstetrics                        Anesthesia Physical Anesthesia Plan  ASA: III  Anesthesia Plan: General   Post-op Pain Management:    Induction: Intravenous  Airway Management Planned: Oral ETT  Additional Equipment:   Intra-op Plan:   Post-operative Plan: Extubation in OR  Informed Consent:   Dental advisory given  Plan Discussed with: CRNA, Anesthesiologist and Surgeon  Anesthesia Plan Comments:        Anesthesia Quick Evaluation

## 2013-02-01 NOTE — Transfer of Care (Signed)
Immediate Anesthesia Transfer of Care Note  Patient: Lori Randall  Procedure(s) Performed: Procedure(s) with comments: PARTIAL MASTECTOMY WITH NEEDLE LOCALIZATION AND AXILLARY SENTINEL LYMPH NODE BX (Right) - needle localization at 7:30 SOLIS nuclear medicine 30 minutes prior to surgery right breast 9:30  Patient Location: PACU  Anesthesia Type:General  Level of Consciousness: awake, alert , oriented and patient cooperative  Airway & Oxygen Therapy: Patient Spontanous Breathing and Patient connected to nasal cannula oxygen  Post-op Assessment: Report given to PACU RN, Post -op Vital signs reviewed and stable and Patient moving all extremities  Post vital signs: Reviewed and stable  Complications: No apparent anesthesia complications

## 2013-02-01 NOTE — Preoperative (Signed)
Beta Blockers   Reason not to administer Beta Blockers:Not Applicable 

## 2013-02-01 NOTE — Telephone Encounter (Signed)
Vicodin 5/325 #40 no refills called in to [facility] [phone] spoke with Arpita-(pharmacist) per HI performed surgery on patient today and forgot to sign RX

## 2013-02-01 NOTE — Progress Notes (Signed)
Pt states sleep study done at [facility]- Dr. Earl Gala. Will request.

## 2013-02-01 NOTE — Anesthesia Postprocedure Evaluation (Signed)
  Anesthesia Post-op Note  Patient: Lori Randall  Procedure(s) Performed: Procedure(s) with comments: PARTIAL MASTECTOMY WITH NEEDLE LOCALIZATION AND AXILLARY SENTINEL LYMPH NODE BX (Right) - needle localization at 7:30 SOL[facility] nuclear medicine 30 minutes prior to surgery right breast 9:30  Patient Location: PACU  Anesthesia Type:General  Level of Consciousness: awake  Airway and Oxygen Therapy: Patient Spontanous Breathing  Post-op Pain: mild  Post-op Assessment: Post-op Vital signs reviewed  Post-op Vital Signs: Reviewed  Complications: No apparent anesthesia complications

## 2013-02-01 NOTE — Interval H&P Note (Signed)
History and Physical Interval Note:  02/01/2013 8:54 AM  Lori Randall  has presented today for surgery, with the diagnosis of right breast cancer   The goals and the various methods of treatment have been discussed with the patient and family. After consideration of risks, benefits and other options for treatment, the patient has consented to  Procedure(s) with comments: PARTIAL MASTECTOMY WITH NEEDLE LOCALIZATION AND AXILLARY SENTINEL LYMPH NODE BX (Right) - needle localization at 7:30 SOL[facility] nuclear medicine 30 minutes prior to surgery right breast 9:30 as a surgical intervention .  The patient's history has been reviewed, patient examined today, no change in status, stable for surgery.  I have reviewed the patient's chart and labs.  Questions were answered to the patient's satisfaction.     Angelia Mould. Derrell Lolling, M.D., [facility] General and Minimally invasive Surgery Breast and Colorectal Surgery Office:   [phone] Pager:   [phone]

## 2013-02-02 ENCOUNTER — Telehealth (INDEPENDENT_AMBULATORY_CARE_PROVIDER_SITE_OTHER): Payer: Self-pay | Admitting: General Surgery

## 2013-02-02 ENCOUNTER — Telehealth (INDEPENDENT_AMBULATORY_CARE_PROVIDER_SITE_OTHER): Payer: Self-pay | Admitting: *Deleted

## 2013-02-02 MED ORDER — PROMETHAZINE HCL 25 MG PO TABS: 25.0000 mg | ORAL_TABLET | ORAL | Status: DC | PRN

## 2013-02-02 NOTE — Telephone Encounter (Signed)
Patient's husband made aware. He will call back if this doesn't help.

## 2013-02-02 NOTE — Telephone Encounter (Signed)
Appt date is changed to 02/10/13 at 430 pm  Patient is  Aware

## 2013-02-02 NOTE — Progress Notes (Signed)
Radiation Oncology         [phone] ________________________________  Name: DESTYNE GOODREAU MRN: [id number]  Date: 01/27/2013  DOB: [birth year]  WU:[id number] Thomasena Edis, MD  Ernestene Mention, MD   Ruthann Cancer, MD  REFERRING PHYSICIAN: Ernestene Mention, MD   DIAGNOSIS: The encounter diagnosis was Cancer of upper-outer quadrant of female breast, right.   HISTORY OF PRESENT ILLNESS::Conner E Forcier is a 62 y.o. female who is seen for an initial consultation visit. The patient was to have a possible abnormality within the left breast on recent screening mammography. Additional views were performed but this area was felt to likely be benign. However an irregular mass was noted within the right breast for which further workup included an ultrasound that did reveal a hypoechoic mass at this location measuring 1.2 cm. This was present within the upper outer quadrant of the right breast. A biopsy of this mass revealed invasive ductal carcinoma which was grade 1. Receptor studies indicated that the tumor was ER positive, PR positive, and HER-2/neu negative. The Ki-67 staining was 14%.  The patient proceeded to undergo an MRI scan of the breasts bilaterally. This showed the above mass which was a solitary finding. This measured 2.1 cm in maximum dimension on MRI scan. No other concerning masses are present in either breast and no axillary lymphadenopathy was present.  The patient denies any breast related symptoms at this time. She is seen in multidisciplinary breast clinic for evaluation.   PREVIOUS RADIATION THERAPY:No}   PAST MEDICAL HISTORY:  has a past medical history of Hypertension; Arthritis; Sleep apnea; and Glaucoma.     PAST SURGICAL HISTORY: Past Surgical History  Procedure Laterality Date  . Appendectomy    . Excisional biopsy right breast       FAMILY HISTORY: family history is not on file.   SOCIAL HISTORY:  reports that she has never smoked. She does not  have any smokeless tobacco history on file. She reports that  drinks alcohol. She reports that she does not use illicit drugs.   ALLERGIES: Review of patient's allergies indicates no known allergies.   MEDICATIONS:  Current Outpatient Prescriptions  Medication Sig Dispense Refill  . atorvastatin (LIPITOR) 80 MG tablet Take 80 mg by mouth daily.      . Cholecalciferol (VITAMIN D3) 2000 UNITS TABS Take 2,000 Units by mouth daily.      Marland Kitchen ezetimibe (ZETIA) 10 MG tablet Take 10 mg by mouth daily.      . fish oil-omega-3 fatty acids 1000 MG capsule Take 1 g by mouth daily.      . hydrochlorothiazide (HYDRODIURIL) 25 MG tablet Take 25 mg by mouth daily.      Marland Kitchen HYDROcodone-acetaminophen (NORCO/VICODIN) 5-325 MG per tablet Take 1-2 tablets by mouth every 4 (four) hours as needed for pain.  40 tablet  1  . meloxicam (MOBIC) 15 MG tablet Take 15 mg by mouth daily.      . promethazine (PHENERGAN) 25 MG tablet Take 1 tablet (25 mg total) by mouth every 4 (four) hours as needed for nausea.  15 tablet  0  . ramipril (ALTACE) 5 MG capsule Take 5 mg by mouth daily.       No current facility-administered medications for this encounter.     REVIEW OF SYSTEMS:  A 15 point review of systems is documented in the electronic medical record. This was obtained by the nursing staff. However, I reviewed this with the patient to discuss relevant findings and  make appropriate changes.  Pertinent items are noted in HPI.    PHYSICAL EXAM:  vitals were not taken for this visit.  General: Well-developed, in no acute distress HEENT: Normocephalic, atraumatic; oral cavity clear Neck: Supple without any lymphadenopathy Cardiovascular: Regular rate and rhythm Respiratory: Clear to auscultation bilaterally Breasts: The right breast is status post biopsy. No palpable abnormality is present. No skin or nipple changes. The left breast is unremarkable. No lymphadenopathy on either side. GI: Soft, nontender, normal bowel  sounds Extremities: No edema present Neuro: No focal deficits     LABORATORY DATA:  Lab Results  Component Value Date   WBC 4.6 02/01/2013   HGB 13.9 02/01/2013   HCT 40.6 02/01/2013   MCV 90.2 02/01/2013   PLT 209 02/01/2013   Lab Results  Component Value Date   NA 140 02/01/2013   K 3.9 02/01/2013   CL 103 02/01/2013   CO2 26 02/01/2013   Lab Results  Component Value Date   ALT 38* 02/01/2013   AST 24 02/01/2013   ALKPHOS 114 02/01/2013   BILITOT 0.4 02/01/2013      RADIOGRAPHY: Chest 2 View  02/01/2013   *RADIOLOGY REPORT*  Clinical Data: Preop for surgery for breast carcinoma  CHEST - 2 VIEW  Comparison: Chest x-ray of 12/17/2006  Findings: No active infiltrate or effusion is seen.  Mediastinal contours are stable.  The heart is within normal limits in size. No bony abnormality is noted.  IMPRESSION: Stable chest x-ray.  No active lung disease.   Original Report Authenticated By: Dwyane Dee, M.D.   Mr Breast Bilateral W Wo Contrast  01/25/2013   **ADDENDUM** CREATED: 01/25/2013 13:10:04  BUN and creatinine were obtained on site at [facility] at [address] Results:  BUN 11 mg/dL,  Creatinine 0.7 mg/dL.  Addended by:  Littie Deeds. Judyann Munson, M.D. on 01/25/2013 13:10:04.  **END ADDENDUM** SIGNED BY: Dina L. Judyann Munson, M.D.  01/25/2013   *RADIOLOGY REPORT*  Clinical Data: Biopsy-proven invasive ductal carcinoma in the 10 o'clock region of the right breast.  BILATERAL BREAST MRI WITH AND WITHOUT CONTRAST  Technique: Multiplanar, multisequence MR images of both breasts were obtained prior to and following the intravenous administration of 20ml of multihance.  Three dimensional images were evaluated at the independent DynaCad workstation.  Comparison:  Mammograms dated 01/13/2013, 01/11/2013, 12/17/2011 and 12/12/2010 from [facility].  Findings: There is a mild background parenchymal enhancement pattern.   In the posterior third of the upper outer quadrant of the right breast there  is a 2.1 x 1.3 x 1.2 cm lobulated, enhancing mass.  It is associated with post-biopsy changes and a signal void artifact from the clip placement.  No abnormal enhancement is seen in the left breast.  There is no enlarged axillary or internal mammary adenopathy.  IMPRESSION: Solitary enhancing mass in the upper outer quadrant of the right breast corresponding well with the known malignancy.  RECOMMENDATION: Treatment planning of the right breast is recommended.  THREE-DIMENSIONAL MR IMAGE RENDERING ON INDEPENDENT WORKSTATION:  Three-dimensional MR images were rendered by post-processing of the original MR data on an independent workstation.  The three- dimensional MR images were interpreted, and findings were reported in the accompanying complete MRI report for this study.  BI-RADS CATEGORY 6:  Known biopsy-proven malignancy - appropriate action should be taken.   Original Report Authenticated By: Baird Lyons, M.D.   Nm Sentinel Node Inj-no Rpt (breast)  02/01/2013   CLINICAL DATA: Cancer right breast  Sulfur colloid was injected intradermally by the nuclear medicine  technologist for breast cancer sentinel node localization.        IMPRESSION: The patient is presenting with an invasive ductal carcinoma of the right breast. Face on MRI scan, this potentially represents a small T2 N0 tumor clinically. The patient is felt to be a good candidate for breast conservation treatment. He has discussed this with surgery and is interested in proceeding with a lumpectomy.  I discussed with the patient the role of adjuvant radiotherapy in the setting of breast conservation treatment. Based on her age and performance status, I would recommend adjuvant radiotherapy to improve her chances of local/regional control. We discussed this rationale as well as the potential side effects and risks of treatment. All of her questions were answered. She does wish to proceed with this overall treatment plan.  The patient has also  been seen by medical oncology and an Oncotype test may also be beneficial depending on her final pathology.   PLAN: The patient will be scheduled for a lumpectomy through Dr. Derrell Lolling. I look forward to seeing the patient postoperatively to review her case at that time and to coordinate an anticipated course of post lumpectomy radiotherapy.    I spent 60 minutes minutes face to face with the patient and more than 50% of that time was spent in counseling and/or coordination of care.    ________________________________   Radene Gunning, MD, PhD

## 2013-02-02 NOTE — Telephone Encounter (Signed)
Surgery for breast cancer which yesterday. I need to see her in approximately 2 weeks. Please assist her in making this appointment.  hmi

## 2013-02-02 NOTE — Telephone Encounter (Signed)
The only thing I see is next week which you are already booked for or in three weeks.  Which would you prefer?

## 2013-02-02 NOTE — Telephone Encounter (Signed)
Husband called to ask for more pain medication due to his wife having surgery yesterday and she is still in pain.  Explained to husband that a prescription had been called in yesterday per the medication list for Norco 5/325mg  1-2 tablets every 4 hours as needed for pain #40 1 refill.  Husband states the pharmacy gave them 4 tablets no refills.  This RN called the pharmacy who stated that was the order they received from [facility] yesterday.  This RN went ahead and corrected to prescription to what Dr. Derrell Lolling had entered into the medication list yesterday for this patient.  Husband updated at this time.

## 2013-02-02 NOTE — Telephone Encounter (Signed)
Per Dr Derrell Lolling ok to call in Phenergen 25 mg po #15 take 1 po q 4 hrs prn nausea, no refill.

## 2013-02-02 NOTE — Telephone Encounter (Signed)
Phenergan e-scribed to [facility] in [place]. Called Patient's husband to make him aware. No voicemail. Will try again later.

## 2013-02-02 NOTE — Telephone Encounter (Signed)
Husband called back to ask about PO appt and when it should be scheduled.  No appt has been scheduled at this time.

## 2013-02-02 NOTE — Telephone Encounter (Signed)
Patient's husband calling stating she has been extremely nauseated since yesterday. Not getting any better. She has not had a pain pill x 12 hours because she is unable to eat. Please advise if okay to call in something for [facility] to [facility] in [place]. [phone].

## 2013-02-02 NOTE — Addendum Note (Signed)
Addended byLiliana Cline on: 02/02/2013 03:26 PM   Modules accepted: Orders

## 2013-02-02 NOTE — Telephone Encounter (Signed)
Spoke with patient's spouse he is aware of  appt on 02/23/13 at 3:35

## 2013-02-03 ENCOUNTER — Telehealth (INDEPENDENT_AMBULATORY_CARE_PROVIDER_SITE_OTHER): Payer: Self-pay

## 2013-02-03 ENCOUNTER — Encounter (HOSPITAL_COMMUNITY): Payer: Self-pay | Admitting: General Surgery

## 2013-02-03 NOTE — Telephone Encounter (Signed)
Patient notified of pathology report.  I confirmed her postop appointment with her.

## 2013-02-03 NOTE — Progress Notes (Signed)
Quick Note:  Inform patient of Pathology report,.Tell her that tumor is 2.1 cm, was completely removed with a negative margin. All 5 lymph nodes are negative. She will not need any further surgery and that is good news. ______

## 2013-02-03 NOTE — Telephone Encounter (Signed)
Message copied by Ivory Broad on Wed Feb 03, 2013  2:46 PM ------      Message from: Lori Randall      Created: Wed Feb 03, 2013  2:24 PM       Inform patient of Pathology report,.Tell her that tumor is 2.1 cm, was completely removed with a negative margin. All 5 lymph nodes are negative. She will not need any further surgery and that is good news. ------

## 2013-02-09 ENCOUNTER — Encounter: Payer: Self-pay | Admitting: *Deleted

## 2013-02-09 NOTE — Progress Notes (Signed)
Oncotype Dx order sent to pathology.  Received confirmation order received.  Faxed order to [facility].

## 2013-02-10 ENCOUNTER — Encounter (INDEPENDENT_AMBULATORY_CARE_PROVIDER_SITE_OTHER): Payer: Self-pay | Admitting: General Surgery

## 2013-02-10 ENCOUNTER — Ambulatory Visit (INDEPENDENT_AMBULATORY_CARE_PROVIDER_SITE_OTHER): Payer: BC Managed Care – PPO | Admitting: General Surgery

## 2013-02-10 VITALS — BP 160/80 | HR 64 | Temp 98.4°F | Resp 16 | Ht 67.0 in | Wt 223.8 lb

## 2013-02-10 DIAGNOSIS — C50411 Malignant neoplasm of upper-outer quadrant of right female breast: Secondary | ICD-10-CM

## 2013-02-10 DIAGNOSIS — C50419 Malignant neoplasm of upper-outer quadrant of unspecified female breast: Secondary | ICD-10-CM

## 2013-02-10 NOTE — Patient Instructions (Signed)
You are recovering from your right breast and right axillary surgery without any obvious surgical complications.  Be sure to stretch the right shoulder frequently.  You may resume all normal physical activities, including swimming and sports in one month  Be sure to make an appointment Dr. Darnelle Catalan and Dr. Mitzi Hansen  Return to see Dr. Derrell Lolling in 2 months, sooner if there are any problems.

## 2013-02-10 NOTE — Progress Notes (Signed)
Patient ID: Lori Randall, female   DOB: November 25, 1950, 62 y.o.   MRN: [id number] History: This patient underwent right partial mastectomy and sentinel node biopsy on 02/01/2013. She seems to be recovering uneventfully. Final pathology report showed invasive ductal carcinoma, 2.1 cm, negative sentinel nodes, negative margins. ER 100%, PR 86%, HER-2-negative, Ki-67 14%. Pathologic stage T1c,  N0.  Exam: Patient looks well. Good spirits. Husband is with her. Right breast incision and right axillary incision are healing normally. Minimal ecchymoses. Minimal swelling. Range of motion right shoulder is 100%  Assessment: Invasive ductal carcinoma right breast, 2.1 cm, receptor positive, HER-2-negative, stage T1c,. N0. Recovering uneventfully in the early postop period following right partial mastectomy and sentinel node biopsy  Plan: Diet & activity discussed Pathology report discussed with patient and I gave her a copy She will make appointments with Dr. Darnelle Catalan and Dr. Mitzi Hansen. Oncotype DX is pending Return to see me in 2 months, sooner if there are problems.   Angelia Mould. Derrell Lolling, M.D., [facility] General and Minimally invasive Surgery Breast and Colorectal Surgery Office:   [phone] Pager:   [phone]

## 2013-02-13 ENCOUNTER — Encounter: Payer: Self-pay | Admitting: *Deleted

## 2013-02-13 NOTE — Progress Notes (Signed)
Mailed after appt letter to pt. 

## 2013-02-16 ENCOUNTER — Encounter: Payer: Self-pay | Admitting: *Deleted

## 2013-02-16 NOTE — Progress Notes (Signed)
Mailed after appt letter to pt. 

## 2013-02-17 ENCOUNTER — Encounter (INDEPENDENT_AMBULATORY_CARE_PROVIDER_SITE_OTHER): Payer: Self-pay

## 2013-02-18 ENCOUNTER — Encounter: Payer: Self-pay | Admitting: *Deleted

## 2013-02-18 NOTE — Progress Notes (Signed)
Received Oncotype Dx results of 13.  Emailed results to MD & placed a copy in his box.  Took a copy to Med Rec to scan.

## 2013-02-24 NOTE — Progress Notes (Signed)
Advanced directives note on dictation by Dr. Darnelle Catalan on 01/28/13

## 2013-02-24 NOTE — Progress Notes (Signed)
Location of Breast Cancer:right    Histology per Pathology Report: Breast, right, needle core biopsy - INVASIVE DUCTAL CARCINOMA. - ASSOCIATED MICROCALCIFICATIONS.  Receptor Status: ER(positive), PR (positive), Her2-neu (negative)  Did patient present with symptoms (if so, please note symptoms) or was this found on screening mammography?: screening mammography  Past/Anticipated interventions by surgeon, if any: PARTIAL MASTECTOMY WITH NEEDLE LOCALIZATION AND AXILLARY SENTINEL LYMPH NODE BX on the right  Past/Anticipated interventions by medical oncology, if any:  Appt with Dr. Darnelle Catalan next Tuesday  Lymphedema issues, if any:  no   Pain issues, if any:  no   SAFETY ISSUES:  Prior radiation? no  Pacemaker/ICD? no  Possible current pregnancy?no  Is the patient on methotrexate? no  Current Complaints / other details:  Lori Randall here with her husband for consult for right breast cancer.  She denies pain.  She reports that she is mostly healed from her mastectomy.

## 2013-02-25 ENCOUNTER — Ambulatory Visit
Admission: RE | Admit: 2013-02-25 | Discharge: 2013-02-25 | Disposition: A | Discharge status: Home / Self Care | Payer: BC Managed Care – PPO | Source: Ambulatory Visit | Attending: Radiation Oncology | Admitting: Radiation Oncology

## 2013-02-25 ENCOUNTER — Encounter: Payer: Self-pay | Admitting: Radiation Oncology

## 2013-02-25 VITALS — BP 153/76 | HR 82 | Temp 98.2°F | Ht 67.0 in | Wt 227.3 lb

## 2013-02-25 DIAGNOSIS — C50411 Malignant neoplasm of upper-outer quadrant of right female breast: Secondary | ICD-10-CM

## 2013-02-25 DIAGNOSIS — C50919 Malignant neoplasm of unspecified site of unspecified female breast: Secondary | ICD-10-CM | POA: Insufficient documentation

## 2013-02-25 DIAGNOSIS — Z79899 Other long term (current) drug therapy: Secondary | ICD-10-CM | POA: Insufficient documentation

## 2013-02-25 NOTE — Progress Notes (Signed)
Radiation Oncology         [phone] ________________________________  Name: Lori Randall MRN: [id number]  Date: 02/25/2013  DOB: 1951-07-17  Follow-Up Visit Note  CC: Astrid Divine, MD  Ernestene Mention, MD  Diagnosis:   Invasive ductal carcinoma of the right breast status post lumpectomy ,pT1cpN0 with negative margins, Oncotype recurrence score equals 13  Interval Since Last Radiation:  Not applicable   Narrative:  The patient returns today for routine follow-up.  The patient was initially seen in multidisciplinary breast clinic. She was felt to be a good candidate for breast conservation treatment. The patient proceeded with a lumpectomy on 02/01/2013. This returned positive for invasive ductal carcinoma with associated microcalcifications. The tumor measured 2.1 cm and was a grade 1 tumor. The margins were negative. 6 sentinel lymph nodes were examined, all negative. Receptor studies have demonstrated that the tumor is ER positive, PR positive, and HER-2/neu negative. The patient's recurrent score as noted above returned as a 13.  The patient indicates that she is healing well from her surgery. She has no complaints in this regard today.                              ALLERGIES:  has No Known Allergies.  Meds: Current Outpatient Prescriptions  Medication Sig Dispense Refill  . atorvastatin (LIPITOR) 80 MG tablet Take 80 mg by mouth daily.      . Cholecalciferol (VITAMIN D3) 2000 UNITS TABS Take 2,000 Units by mouth daily.      Marland Kitchen ezetimibe (ZETIA) 10 MG tablet Take 10 mg by mouth daily.      . fish oil-omega-3 fatty acids 1000 MG capsule Take 1 g by mouth daily.      . hydrochlorothiazide (HYDRODIURIL) 25 MG tablet Take 25 mg by mouth daily.      . meloxicam (MOBIC) 15 MG tablet Take 15 mg by mouth daily.      . promethazine (PHENERGAN) 25 MG tablet Take 1 tablet (25 mg total) by mouth every 4 (four) hours as needed for nausea.  15 tablet  0  . ramipril (ALTACE) 5 MG  capsule Take 5 mg by mouth daily.      Marland Kitchen HYDROcodone-acetaminophen (NORCO/VICODIN) 5-325 MG per tablet Take 1-2 tablets by mouth every 4 (four) hours as needed for pain.  40 tablet  1   No current facility-administered medications for this encounter.    Physical Findings: The patient is in no acute distress. Patient is alert and oriented.  height is 5\' 7"  (1.702 m) and weight is 227 lb 4.8 oz (103.103 kg). Her temperature is 98.2 F (36.8 C). Her blood pressure is 153/76 and her pulse is 82. .   The right breast is status post lumpectomy and sentinel lymph node biopsy. 2 well-healed surgical incisions.  Lab Findings: Lab Results  Component Value Date   WBC 4.6 02/01/2013   HGB 13.9 02/01/2013   HCT 40.6 02/01/2013   MCV 90.2 02/01/2013   PLT 209 02/01/2013     Radiographic Findings: Chest 2 View  02/01/2013   *RADIOLOGY REPORT*  Clinical Data: Preop for surgery for breast carcinoma  CHEST - 2 VIEW  Comparison: Chest x-ray of 12/17/2006  Findings: No active infiltrate or effusion is seen.  Mediastinal contours are stable.  The heart is within normal limits in size. No bony abnormality is noted.  IMPRESSION: Stable chest x-ray.  No active lung disease.  Original Report Authenticated By: Dwyane Dee, M.D.   Nm Sentinel Node Inj-no Rpt (breast)  02/01/2013   CLINICAL DATA: Cancer right breast   Sulfur colloid was injected intradermally by the nuclear medicine  technologist for breast cancer sentinel node localization.     Impression:    The patient is status post lumpectomy for her diagnosis of invasive ductal carcinoma of the right breast corresponded to a pT2pN0 tumor. Negative margins were achieved. The patient is appropriate to proceed with adjuvant radiotherapy at the appropriate time. The patient's Oncotype recurrence score was 13. She is seeing medical oncology next week to make a final decision regarding chemotherapy.  In the event that the patient does not proceed with chemotherapy.  I'm going to go ahead and have the patient scheduled tentatively for a simulation next week after this visit.  I discussed with the patient an anticipated 6-1/2 week course of radiotherapy. We discussed the rationale of such a treatment in terms of improved local/regional control. We also discussed the potential side effects and risks of treatment. All of her questions were answered.  She does wish to proceed with this overall treatment plan.  Plan:  I will attempt to schedule the patient tentatively for a simulation on 03/03/2013.  I spent 25 minutes with the patient today, the majority of which was spent counseling the patient on the diagnosis of cancer and coordinating care.   Radene Gunning, M.D., Ph.D.

## 2013-02-25 NOTE — Progress Notes (Signed)
Please see the Nurse Progress Note in the MD Initial Consult Encounter for this patient. 

## 2013-03-01 ENCOUNTER — Encounter (INDEPENDENT_AMBULATORY_CARE_PROVIDER_SITE_OTHER): Payer: Self-pay | Admitting: Surgery

## 2013-03-01 ENCOUNTER — Ambulatory Visit (INDEPENDENT_AMBULATORY_CARE_PROVIDER_SITE_OTHER): Payer: BC Managed Care – PPO | Admitting: Surgery

## 2013-03-01 VITALS — BP 160/92 | HR 84 | Resp 16 | Ht 67.0 in | Wt 223.4 lb

## 2013-03-01 DIAGNOSIS — C50411 Malignant neoplasm of upper-outer quadrant of right female breast: Secondary | ICD-10-CM

## 2013-03-01 DIAGNOSIS — C50419 Malignant neoplasm of upper-outer quadrant of unspecified female breast: Secondary | ICD-10-CM

## 2013-03-01 NOTE — Progress Notes (Signed)
Subjective:     Patient ID: Lori Randall, female   DOB: January 25, 1951, 62 y.o.   MRN: [id number]  HPI  Lori Randall  1950-12-28 [id number]  Patient Care Team: Maurice Small, MD as PCP - General (Family Medicine)  This patient is a 62 y.o.female who presents today for surgical evaluation at the request of self.   Reason for visit: Possible separation of the axillary incision  Pleasant woman status post lumpectomy and axillary dissection for right breast cancer last month.  Healed well.  She felt a pull on her right armpit and was concerned that the corner of her axillary incision had opened up.  No drainage.  No fevers or chills.  No tenderness.  Activity level fine.  Exercising fine.  She comes in today with her husband.  Patient Active Problem List   Diagnosis Date Noted  . Cancer of upper-outer quadrant of female breast 01/18/2013    Past Medical History  Diagnosis Date  . Hypertension   . Arthritis   . Sleep apnea     Past Surgical History  Procedure Laterality Date  . Appendectomy    . Excisional biopsy right breast    . Partial mastectomy with needle localization and axillary sentinel lymph node bx Right 02/01/2013    Procedure: PARTIAL MASTECTOMY WITH NEEDLE LOCALIZATION AND AXILLARY SENTINEL LYMPH NODE BX;  Surgeon: Ernestene Mention, MD;  Location: [facility] OR;  Service: General;  Laterality: Right;  needle localization at 7:30 [facility] nuclear medicine 30 minutes prior to surgery right breast 9:30    History   Social History  . Marital Status: Married    Spouse Name: N/A    Number of Children: N/A  . Years of Education: N/A   Occupational History  . Not on file.   Social History Main Topics  . Smoking status: Never Smoker   . Smokeless tobacco: Not on file  . Alcohol Use: No     Comment: occasional  . Drug Use: No  . Sexually Active: Not on file   Other Topics Concern  . Not on file   Social History Narrative  . No narrative on file    History  reviewed. No pertinent family history.  Current Outpatient Prescriptions  Medication Sig Dispense Refill  . atorvastatin (LIPITOR) 80 MG tablet Take 80 mg by mouth daily.      . Cholecalciferol (VITAMIN D3) 2000 UNITS TABS Take 2,000 Units by mouth daily.      Marland Kitchen ezetimibe (ZETIA) 10 MG tablet Take 10 mg by mouth daily.      . fish oil-omega-3 fatty acids 1000 MG capsule Take 1 g by mouth daily.      . hydrochlorothiazide (HYDRODIURIL) 25 MG tablet Take 25 mg by mouth daily.      Marland Kitchen HYDROcodone-acetaminophen (NORCO/VICODIN) 5-325 MG per tablet Take 1-2 tablets by mouth every 4 (four) hours as needed for pain.  40 tablet  1  . meloxicam (MOBIC) 15 MG tablet Take 15 mg by mouth daily.      . promethazine (PHENERGAN) 25 MG tablet Take 1 tablet (25 mg total) by mouth every 4 (four) hours as needed for nausea.  15 tablet  0  . ramipril (ALTACE) 5 MG capsule Take 5 mg by mouth daily.       No current facility-administered medications for this visit.     No Known Allergies  BP 160/92  Pulse 84  Resp 16  Ht 5\' 7"  (1.702 m)  Wt 223 lb  6.4 oz (101.334 kg)  BMI 34.98 kg/m2  Chest 2 View  02/01/2013   *RADIOLOGY REPORT*  Clinical Data: Preop for surgery for breast carcinoma  CHEST - 2 VIEW  Comparison: Chest x-ray of 12/17/2006  Findings: No active infiltrate or effusion is seen.  Mediastinal contours are stable.  The heart is within normal limits in size. No bony abnormality is noted.  IMPRESSION: Stable chest x-ray.  No active lung disease.   Original Report Authenticated By: Dwyane Dee, M.D.   Nm Sentinel Node Inj-no Rpt (breast)  02/01/2013   CLINICAL DATA: Cancer right breast   Sulfur colloid was injected intradermally by the nuclear medicine  technologist for breast cancer sentinel node localization.      Review of Systems  Constitutional: Negative for fever, chills and diaphoresis.  HENT: Negative for ear pain, sore throat and trouble swallowing.   Eyes: Negative for photophobia and  visual disturbance.  Respiratory: Negative for cough and choking.   Cardiovascular: Negative for chest pain and palpitations.  Gastrointestinal: Negative for nausea, vomiting, abdominal pain, diarrhea, constipation, anal bleeding and rectal pain.  Genitourinary: Negative for dysuria, frequency and difficulty urinating.  Musculoskeletal: Negative for myalgias and gait problem.  Skin: Negative for color change, pallor and rash.  Neurological: Negative for dizziness, speech difficulty, weakness and numbness.  Hematological: Negative for adenopathy.  Psychiatric/Behavioral: Negative for confusion and agitation. The patient is not nervous/anxious.        Objective:   Physical Exam  Constitutional: She is oriented to person, place, and time. She appears well-developed and well-nourished. No distress.  HENT:  Head: Normocephalic.  Mouth/Throat: Oropharynx is clear and moist. No oropharyngeal exudate.  Eyes: Conjunctivae and EOM are normal. Pupils are equal, round, and reactive to light. No scleral icterus.  Neck: Normal range of motion. No tracheal deviation present.  Cardiovascular: Normal rate and intact distal pulses.   Pulmonary/Chest: Effort normal. No respiratory distress. She exhibits no tenderness.    Abdominal: Soft. She exhibits no distension. There is no tenderness. Hernia confirmed negative in the right inguinal area and confirmed negative in the left inguinal area.  Incisions clean with normal healing ridges.  No hernias  Genitourinary: No vaginal discharge found.  Musculoskeletal: Normal range of motion. She exhibits no tenderness.  Lymphadenopathy:       Right: No inguinal adenopathy present.       Left: No inguinal adenopathy present.  Neurological: She is alert and oriented to person, place, and time. No cranial nerve deficit. She exhibits normal muscle tone. Coordination normal.  Skin: Skin is warm and dry. No rash noted. She is not diaphoretic.  Psychiatric: She has a  normal mood and affect. Her behavior is normal.       Assessment:     Incisions healing well.   She is outside the window of infection or abscess     Plan:     I noted I do not see anything that requires intervention.  She & her husband seemed reassured  Increase activity as tolerated to regular activity.  Low impact exercise such as walking an hour a day at least ideal.  Do not push through pain.  Continue exercises to allow her Right axilla to heal  Return to clinic With Dr. Derrell Lolling as scheduled, otherwise as needed.   Instructions discussed.  Followup with primary care physician for other health issues as would normally be done.  Questions answered.  The patient expressed understanding and appreciation

## 2013-03-01 NOTE — Patient Instructions (Signed)
GENERAL SURGERY: POST OP INSTRUCTIONS ° °1. DIET: Follow a light bland diet the first 24 hours after arrival home, such as soup, liquids, crackers, etc.  Be sure to include lots of fluids daily.  Avoid fast food or heavy meals as your are more likely to get nauseated.   °2. Take your usually prescribed home medications unless otherwise directed. °3. PAIN CONTROL: °a. Pain is best controlled by a usual combination of three different methods TOGETHER: °i. Ice/Heat °ii. Over the counter pain medication °iii. Prescription pain medication °b. Most patients will experience some swelling and bruising around the incisions.  Ice packs or heating pads (30-60 minutes up to 6 times a day) will help. Use ice for the first few days to help decrease swelling and bruising, then switch to heat to help relax tight/sore spots and speed recovery.  Some people prefer to use ice alone, heat alone, alternating between ice & heat.  Experiment to what works for you.  Swelling and bruising can take several weeks to resolve.   °c. It is helpful to take an over-the-counter pain medication regularly for the first few weeks.  Choose one of the following that works best for you: °i. Naproxen (Aleve, etc)  Two 220mg tabs twice a day °ii. Ibuprofen (Advil, etc) Three 200mg tabs four times a day (every meal & bedtime) °iii. Acetaminophen (Tylenol, etc) 500-650mg four times a day (every meal & bedtime) °d. A  prescription for pain medication (such as oxycodone, hydrocodone, etc) should be given to you upon discharge.  Take your pain medication as prescribed.  °i. If you are having problems/concerns with the prescription medicine (does not control pain, nausea, vomiting, rash, itching, etc), please call us [phone] to see if we need to switch you to a different pain medicine that will work better for you and/or control your side effect better. °ii. If you need a refill on your pain medication, please contact your pharmacy.  They will contact our  office to request authorization. Prescriptions will not be filled after 5 pm or on week-ends. °4. Avoid getting constipated.  Between the surgery and the pain medications, it is common to experience some constipation.  Increasing fluid intake and taking a fiber supplement (such as Metamucil, Citrucel, FiberCon, MiraLax, etc) 1-2 times a day regularly will usually help prevent this problem from occurring.  A mild laxative (prune juice, Milk of Magnesia, MiraLax, etc) should be taken according to package directions if there are no bowel movements after 48 hours.   °5. Wash / shower every day.  You may shower over the dressings as they are waterproof.  Continue to shower over incision(s) after the dressing is off. °6. Remove your waterproof bandages 5 days after surgery.  You may leave the incision open to air.  You may have skin tapes (Steri Strips) covering the incision(s).  Leave them on until one week, then remove.  You may replace a dressing/Band-Aid to cover the incision for comfort if you wish.  ° ° ° ° °7. ACTIVITIES as tolerated:   °a. You may resume regular (light) daily activities beginning the next day--such as daily self-care, walking, climbing stairs--gradually increasing activities as tolerated.  If you can walk 30 minutes without difficulty, it is safe to try more intense activity such as jogging, treadmill, bicycling, low-impact aerobics, swimming, etc. °b. Save the most intensive and strenuous activity for last such as sit-ups, heavy lifting, contact sports, etc  Refrain from any heavy lifting or straining until you   are off narcotics for pain control.   °c. DO NOT PUSH THROUGH PAIN.  Let pain be your guide: If it hurts to do something, don't do it.  Pain is your body warning you to avoid that activity for another week until the pain goes down. °d. You may drive when you are no longer taking prescription pain medication, you can comfortably wear a seatbelt, and you can safely maneuver your car and  apply brakes. °e. You may have sexual intercourse when it is comfortable.  °8. FOLLOW UP in our office °a. Please call [facility] at [phone] to set up an appointment to see your surgeon in the office for a follow-up appointment approximately 2-3 weeks after your surgery. °b. Make sure that you call for this appointment the day you arrive home to insure a convenient appointment time. °9. IF YOU HAVE DISABILITY OR FAMILY LEAVE FORMS, BRING THEM TO THE OFFICE FOR PROCESSING.  DO NOT GIVE THEM TO YOUR DOCTOR. ° ° °WHEN TO CALL US [phone]: °1. Poor pain control °2. Reactions / problems with new medications (rash/itching, nausea, etc)  °3. Fever over 101.5 F (38.5 C) °4. Worsening swelling or bruising °5. Continued bleeding from incision. °6. Increased pain, redness, or drainage from the incision °7. Difficulty breathing / swallowing ° ° The clinic staff is available to answer your questions during regular business hours (8:30am-5pm).  Please don’t hesitate to call and ask to speak to one of our nurses for clinical concerns.  ° If you have a medical emergency, go to the nearest emergency room or call 911. ° A surgeon from [facility] is always on call at the hospitals ° ° °[facility] °[address], [address], [place], [place]  [address] ? °MAIN: [phone] ? TOLL FREE: [phone] ?  °FAX [phone] °[email or web address] ° °

## 2013-03-02 ENCOUNTER — Telehealth: Payer: Self-pay | Admitting: Oncology

## 2013-03-02 ENCOUNTER — Ambulatory Visit (HOSPITAL_BASED_OUTPATIENT_CLINIC_OR_DEPARTMENT_OTHER): Payer: BC Managed Care – PPO | Admitting: Oncology

## 2013-03-02 VITALS — BP 152/74 | HR 76 | Temp 98.6°F | Resp 20 | Ht 67.0 in | Wt 223.6 lb

## 2013-03-02 DIAGNOSIS — C50419 Malignant neoplasm of upper-outer quadrant of unspecified female breast: Secondary | ICD-10-CM

## 2013-03-02 NOTE — Progress Notes (Signed)
ID: Laural Randall OB: Apr 21, 1951  MR#: [id number]  WUJ#:[id number]  PCP: Astrid Divine, MD GYN:   SU: Claud Kelp OTHER MD: Maurice Small, Rogelia Mire, Riley Churches   HISTORY OF PRESENT ILLNESS: "Lori Randall" had routine screening mammography at [facility] 01/11/2013 showing a potential abnormality in the left breast. Additional views 01/13/2013 found that the calcifications noted in the left breast were likely benign. However on the right a previously noted mass was now irregular in contour. There were also some associated calcifications. A right breast ultrasound found a hypoechoic mass collar than wide measuring 1.2 cm. Biopsy of this mass the same day ([id number]) showed an invasive ductal carcinoma, grade 1, estrogen receptor 100% positive, progesterone receptor 86% positive, with an MIB-1 of 14% and no HER-2 amplification.  Bilateral breast MRIs 01/25/2013 showed a 2.1 cm lobulated enhancing mass in the posterior third of the upper outer quadrant of the right breast there were no other areas of concern in either breast and no enlarged axillary or internal mammary adenopathy.  The patient's subsequent history is as detailed below   INTERVAL HISTORY: Lori Randall returns today for followup of her breast cancer. Since her last visit here she had her definitive surgery, which confirmed a stage IIA, and provided [place] with an Oncotype score which was low, confirming no benefit from chemotherapy. Accordingly she is scheduled to start her radiation treatments.  REVIEW OF SYSTEMS: She did well with the surgery, with no unusual pain, bleeding or fever. She has a small area of possible dehiscence which she wants me to look at. She also shared some concerns regarding 1 of her children. Aside from mild and stable scattered arthritic symptoms, a detailed review of systems today was noncontributory  PAST MEDICAL HISTORY: Past Medical History  Diagnosis Date  . Hypertension   . Arthritis    . Sleep apnea     PAST SURGICAL HISTORY: Past Surgical History  Procedure Laterality Date  . Appendectomy    . Excisional biopsy right breast    . Partial mastectomy with needle localization and axillary sentinel lymph node bx Right 02/01/2013    Procedure: PARTIAL MASTECTOMY WITH NEEDLE LOCALIZATION AND AXILLARY SENTINEL LYMPH NODE BX;  Surgeon: Ernestene Mention, MD;  Location: [facility] OR;  Service: General;  Laterality: Right;  needle localization at 7:30 SOLIS nuclear medicine 30 minutes prior to surgery right breast 9:30    FAMILY HISTORY No family history on file. The patient's father died at the age of 4 with congestive 4 [name] in the setting of severe dementia. The patient's mother died at age 80 with atypical parkinsonism. The patient has 3 brothers and 2 sisters. There is no history of breast or ovarian cancer in the family  GYNECOLOGIC HISTORY:  Menarche age 62, first live birth age 62. The patient is GX P2. She went through menopause approximately 2004. She took hormone replacement approximately 2 years. She took birth control remotely for approximately 10 years, without complications.  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 Her husband Lori Randall") M. Multimedia programmer used to work for [employer]. He is now retired. Daughter Lori Randall is a paramedic in [employer]. Son Lori Randall is a landscaper in [place]. The patient has no grandchildren. She attends a [employer]    ADVANCED DIRECTIVES: In place.   HEALTH MAINTENANCE: History  Substance Use Topics  . Smoking status: Never Smoker   . Smokeless tobacco: Not on file  . Alcohol Use:  No     Comment: occasional     Colonoscopy: 2005  PAP: 2012  Bone density:  Lipid panel:  No Known Allergies  Current Outpatient Prescriptions  Medication Sig Dispense Refill  . atorvastatin (LIPITOR) 80 MG tablet Take 80 mg by mouth daily.      . Cholecalciferol (VITAMIN D3) 2000 UNITS TABS Take  2,000 Units by mouth daily.      Marland Kitchen ezetimibe (ZETIA) 10 MG tablet Take 10 mg by mouth daily.      . fish oil-omega-3 fatty acids 1000 MG capsule Take 1 g by mouth daily.      . hydrochlorothiazide (HYDRODIURIL) 25 MG tablet Take 25 mg by mouth daily.      Marland Kitchen HYDROcodone-acetaminophen (NORCO/VICODIN) 5-325 MG per tablet Take 1-2 tablets by mouth every 4 (four) hours as needed for pain.  40 tablet  1  . meloxicam (MOBIC) 15 MG tablet Take 15 mg by mouth daily.      . promethazine (PHENERGAN) 25 MG tablet Take 1 tablet (25 mg total) by mouth every 4 (four) hours as needed for nausea.  15 tablet  0  . ramipril (ALTACE) 5 MG capsule Take 5 mg by mouth daily.       No current facility-administered medications for this visit.    OBJECTIVE: Middle-aged white woman who appears stated age 62 Vitals:   03/02/13 1422  BP: 152/74  Pulse: 76  Temp: 98.6 F (37 C)  Resp: 20     Body mass index is 35.01 kg/(m^2).    ECOG FS: 0  Sclerae unicteric, pupils equal round and reactive to light Oropharynx clear No cervical or supraclavicular adenopathy Lungs no rales or rhonchi Heart regular rate and rhythm Abd obese, benign MSK no focal spinal tenderness, no peripheral edema Neuro: non-focal, well-oriented, appropriate affect Breasts: The right breast is status post lumpectomy. In the axilla there is a small area of erythema, but no deep dehiscence. There is no evidence of residual or recurrent disease. The left breast is unremarkable.   LAB RESULTS:  CMP     Component Value Date/Time   NA 140 02/01/2013 0931   NA 142 01/27/2013 1208   K 3.9 02/01/2013 0931   K 3.8 01/27/2013 1208   CL 103 02/01/2013 0931   CL 105 01/27/2013 1208   CO2 26 02/01/2013 0931   CO2 27 01/27/2013 1208   GLUCOSE 109* 02/01/2013 0931   GLUCOSE 123* 01/27/2013 1208   BUN 13 02/01/2013 0931   BUN 14.2 01/27/2013 1208   CREATININE 0.61 02/01/2013 0931   CREATININE 0.8 01/27/2013 1208   CALCIUM 10.5 02/01/2013 0931   CALCIUM  11.3* 01/27/2013 1208   PROT 7.4 02/01/2013 0931   PROT 7.5 01/27/2013 1208   ALBUMIN 4.3 02/01/2013 0931   ALBUMIN 4.1 01/27/2013 1208   AST 24 02/01/2013 0931   AST 26 01/27/2013 1208   ALT 38* 02/01/2013 0931   ALT 39 01/27/2013 1208   ALKPHOS 114 02/01/2013 0931   ALKPHOS 115 01/27/2013 1208   BILITOT 0.4 02/01/2013 0931   BILITOT 0.47 01/27/2013 1208   GFRNONAA >90 02/01/2013 0931   GFRAA >90 02/01/2013 0931    I No results found for this basename: SPEP,  UPEP,   kappa and lambda light chains    Lab Results  Component Value Date   WBC 4.6 02/01/2013   NEUTROABS 2.8 02/01/2013   HGB 13.9 02/01/2013   HCT 40.6 02/01/2013   MCV 90.2 02/01/2013   PLT 209  02/01/2013      Chemistry      Component Value Date/Time   NA 140 02/01/2013 0931   NA 142 01/27/2013 1208   K 3.9 02/01/2013 0931   K 3.8 01/27/2013 1208   CL 103 02/01/2013 0931   CL 105 01/27/2013 1208   CO2 26 02/01/2013 0931   CO2 27 01/27/2013 1208   BUN 13 02/01/2013 0931   BUN 14.2 01/27/2013 1208   CREATININE 0.61 02/01/2013 0931   CREATININE 0.8 01/27/2013 1208      Component Value Date/Time   CALCIUM 10.5 02/01/2013 0931   CALCIUM 11.3* 01/27/2013 1208   ALKPHOS 114 02/01/2013 0931   ALKPHOS 115 01/27/2013 1208   AST 24 02/01/2013 0931   AST 26 01/27/2013 1208   ALT 38* 02/01/2013 0931   ALT 39 01/27/2013 1208   BILITOT 0.4 02/01/2013 0931   BILITOT 0.47 01/27/2013 1208       No results found for this basename: LABCA2    No components found with this basename: LABCA125    No results found for this basename: INR,  in the last 168 hours  Urinalysis    Component Value Date/Time   COLORURINE YELLOW 02/01/2013 0925    STUDIES: No results found.   ASSESSMENT: 62 y.o. [place], [place] woman status post right breast biopsy 01/13/2013 for a clinical T2 N0, stage IIA invasive ductal carcinoma, grade 1, estrogen receptor 100% positive, progesterone receptor 86% positive, with an MIB-1 of 14% and no HER-2 amplification  (1) status  post right lumpectomy and sentinel lymph node dissection 02/01/2013 for a pT2 pN0, stage IIA invasive ductal carcinoma, grade 1, with repeat HER-2 negative and ample margins.  (2) Oncotype DX score of 13 predicts a risk of distant recurrence of 8% within 10 years if the patient's only systemic therapy is tamoxifen for 5 years. It also predicts no significant benefit from chemotherapy  (3) adjuvant radiation treatment schedule to be completed 04/27/2013  PLAN: Lori Randall understands her Oncotype is based on 5 years of tamoxifen, which is no longer the standard of care. We have more effective options, starting with 10 years of tamoxifen, 5 years of an aromatase inhibitor, or several combinations of agents. Today we spent the better part of her 30 minute visit discussing the various toxicities, side effects and complications of anti-estrogens and I gave her printed materials to review. She is going to see me after completing the radiation to make a definitive decision. Otherwise she knows to call for any problems that may develop before her next visit here. Lowella Dell, MD   03/02/2013 2:42 PM

## 2013-03-03 ENCOUNTER — Ambulatory Visit
Admission: RE | Admit: 2013-03-03 | Discharge: 2013-03-03 | Disposition: A | Discharge status: Home / Self Care | Payer: BC Managed Care – PPO | Source: Ambulatory Visit | Attending: Radiation Oncology | Admitting: Radiation Oncology

## 2013-03-03 DIAGNOSIS — L589 Radiodermatitis, unspecified: Secondary | ICD-10-CM | POA: Insufficient documentation

## 2013-03-03 DIAGNOSIS — Y842 Radiological procedure and radiotherapy as the cause of abnormal reaction of the patient, or of later complication, without mention of misadventure at the time of the procedure: Secondary | ICD-10-CM | POA: Insufficient documentation

## 2013-03-03 DIAGNOSIS — R21 Rash and other nonspecific skin eruption: Secondary | ICD-10-CM | POA: Insufficient documentation

## 2013-03-03 DIAGNOSIS — Z510 Encounter for antineoplastic radiation therapy: Secondary | ICD-10-CM | POA: Insufficient documentation

## 2013-03-03 DIAGNOSIS — L299 Pruritus, unspecified: Secondary | ICD-10-CM | POA: Insufficient documentation

## 2013-03-03 DIAGNOSIS — Y838 Other surgical procedures as the cause of abnormal reaction of the patient, or of later complication, without mention of misadventure at the time of the procedure: Secondary | ICD-10-CM | POA: Insufficient documentation

## 2013-03-03 DIAGNOSIS — IMO0002 Reserved for concepts with insufficient information to code with codable children: Secondary | ICD-10-CM | POA: Insufficient documentation

## 2013-03-03 DIAGNOSIS — C50919 Malignant neoplasm of unspecified site of unspecified female breast: Secondary | ICD-10-CM | POA: Insufficient documentation

## 2013-03-03 DIAGNOSIS — L989 Disorder of the skin and subcutaneous tissue, unspecified: Secondary | ICD-10-CM | POA: Insufficient documentation

## 2013-03-03 DIAGNOSIS — L539 Erythematous condition, unspecified: Secondary | ICD-10-CM | POA: Insufficient documentation

## 2013-03-03 DIAGNOSIS — C50411 Malignant neoplasm of upper-outer quadrant of right female breast: Secondary | ICD-10-CM

## 2013-03-05 ENCOUNTER — Telehealth: Payer: Self-pay | Admitting: Oncology

## 2013-03-05 NOTE — Telephone Encounter (Signed)
Fax medical records to [facility]. °

## 2013-03-10 ENCOUNTER — Ambulatory Visit
Admission: RE | Admit: 2013-03-10 | Discharge: 2013-03-10 | Disposition: A | Discharge status: Home / Self Care | Payer: BC Managed Care – PPO | Source: Ambulatory Visit | Attending: Radiation Oncology | Admitting: Radiation Oncology

## 2013-03-10 DIAGNOSIS — C50411 Malignant neoplasm of upper-outer quadrant of right female breast: Secondary | ICD-10-CM

## 2013-03-10 NOTE — Progress Notes (Signed)
  Radiation Oncology         [phone]) [phone] ________________________________  Name: MERCEDIES GANESH MRN: [id number]  Date: 03/10/2013  DOB: November 26, 1950  Simulation Verification Note   NARRATIVE: The patient was brought to the treatment unit and placed in the planned treatment position. The clinical setup was verified. Then port films were obtained and uploaded to the radiation oncology medical record software.  The treatment beams were carefully compared against the planned radiation fields. The position, location, and shape of the radiation fields was reviewed. The targeted volume of tissue appears to be appropriately covered by the radiation beams. Based on my personal review, I approved the simulation verification. The patient's treatment will proceed as planned.  ________________________________   Radene Gunning, MD, PhD

## 2013-03-11 ENCOUNTER — Ambulatory Visit
Admission: RE | Admit: 2013-03-11 | Discharge: 2013-03-11 | Disposition: A | Discharge status: Home / Self Care | Payer: BC Managed Care – PPO | Source: Ambulatory Visit | Attending: Radiation Oncology | Admitting: Radiation Oncology

## 2013-03-12 ENCOUNTER — Ambulatory Visit
Admission: RE | Admit: 2013-03-12 | Discharge: 2013-03-12 | Disposition: A | Discharge status: Home / Self Care | Payer: BC Managed Care – PPO | Source: Ambulatory Visit | Attending: Radiation Oncology | Admitting: Radiation Oncology

## 2013-03-15 ENCOUNTER — Ambulatory Visit
Admission: RE | Admit: 2013-03-15 | Discharge: 2013-03-15 | Disposition: A | Discharge status: Home / Self Care | Payer: BC Managed Care – PPO | Source: Ambulatory Visit | Attending: Radiation Oncology | Admitting: Radiation Oncology

## 2013-03-16 ENCOUNTER — Ambulatory Visit
Admission: RE | Admit: 2013-03-16 | Discharge: 2013-03-16 | Disposition: A | Discharge status: Home / Self Care | Payer: BC Managed Care – PPO | Source: Ambulatory Visit | Attending: Radiation Oncology | Admitting: Radiation Oncology

## 2013-03-17 ENCOUNTER — Ambulatory Visit
Admission: RE | Admit: 2013-03-17 | Discharge: 2013-03-17 | Disposition: A | Discharge status: Home / Self Care | Payer: BC Managed Care – PPO | Source: Ambulatory Visit | Attending: Radiation Oncology | Admitting: Radiation Oncology

## 2013-03-17 ENCOUNTER — Encounter: Payer: Self-pay | Admitting: Radiation Oncology

## 2013-03-17 VITALS — BP 133/69 | HR 72 | Temp 99.3°F | Resp 20 | Wt 224.5 lb

## 2013-03-17 DIAGNOSIS — C50411 Malignant neoplasm of upper-outer quadrant of right female breast: Secondary | ICD-10-CM

## 2013-03-17 MED ORDER — RADIAPLEXRX EX GEL
Freq: Once | CUTANEOUS | Status: AC
  Administered 2013-03-17: 14:00:00 via TOPICAL

## 2013-03-17 MED ORDER — ALRA NON-METALLIC DEODORANT (RAD-ONC)
1.0000 "application " | Freq: Once | TOPICAL | Status: AC
  Administered 2013-03-17: 1 via TOPICAL

## 2013-03-17 NOTE — Progress Notes (Signed)
  Radiation Oncology         [phone]) [phone] ________________________________  Name: Lori Randall MRN: [id number]  Date: 03/03/2013  DOB: 1951-03-24    SIMULATION AND TREATMENT PLANNING NOTE  The patient presented for simulation prior to beginning her course of radiation treatment for her diagnosis of right-sided breast cancer. The patient was placed in a prone position on a prone breast board. A customized accuform device was also constructed and this complex treatment device will be used on a daily basis during her treatment. In this fashion, a CT scan was obtained through the chest area and an isocenter was placed near the chest wall within the right breast.  The patient will be planned to receive a course of radiation initially to a dose of 45 gray. This will consist of a whole breast radiotherapy technique. To accomplish this, 2 customized blocks have been designed which will correspond to medial and lateral whole breast tangent fields. This treatment will be accomplished at 1.8 gray per fraction. A complex isodose plan is requested to ensure that the breast target area is adequately covered dosimetrically. A forward planning technique will also be evaluated to determine if this approach improves the plan. It is anticipated that the patient will then receive a 16 gray boost to the seroma cavity which has been contoured. This will be accomplished at 2 gray per fraction. The final anticipated total dose therefore will correspond to 61 gray.    _______________________________   Radene Gunning, MD, PhD

## 2013-03-17 NOTE — Progress Notes (Signed)
Weekly rad txs rt breast, no skin changes, pt education done, radiation book, alra, radiaplex gel, given, discusses skin irritation,fatigue, pain, all questions answered, teach back no c/o pain does have twinges stated occasionally in breast,no nausea, does have a slight headaache 2:09 PM

## 2013-03-17 NOTE — Progress Notes (Signed)
   Department of Radiation Oncology  Phone:  [phone] Fax:        [phone]  Weekly Treatment Note    Name: Lori Randall Date: 03/17/2013 MRN: [id number] DOB: [birth year]   Current dose: 9 Gy  Current fraction: 5   MEDICATIONS: Current Outpatient Prescriptions  Medication Sig Dispense Refill  . hyaluronate sodium (RADIAPLEXRX) GEL Apply 1 application topically 2 (two) times daily. Apply after rad and bedtime daily      . non-metallic deodorant (ALRA) MISC Apply 1 application topically daily as needed.      Marland Kitchen atorvastatin (LIPITOR) 80 MG tablet Take 80 mg by mouth daily.      . Cholecalciferol (VITAMIN D3) 2000 UNITS TABS Take 2,000 Units by mouth daily.      Marland Kitchen ezetimibe (ZETIA) 10 MG tablet Take 10 mg by mouth daily.      . fish oil-omega-3 fatty acids 1000 MG capsule Take 1 g by mouth daily.      . hydrochlorothiazide (HYDRODIURIL) 25 MG tablet Take 25 mg by mouth daily.      Marland Kitchen HYDROcodone-acetaminophen (NORCO/VICODIN) 5-325 MG per tablet Take 1-2 tablets by mouth every 4 (four) hours as needed for pain.  40 tablet  1  . meloxicam (MOBIC) 15 MG tablet Take 15 mg by mouth daily.      . promethazine (PHENERGAN) 25 MG tablet Take 1 tablet (25 mg total) by mouth every 4 (four) hours as needed for nausea.  15 tablet  0  . ramipril (ALTACE) 5 MG capsule Take 5 mg by mouth daily.       No current facility-administered medications for this encounter.     ALLERGIES: Review of patient's allergies indicates no known allergies.   LABORATORY DATA:  Lab Results  Component Value Date   WBC 4.6 02/01/2013   HGB 13.9 02/01/2013   HCT 40.6 02/01/2013   MCV 90.2 02/01/2013   PLT 209 02/01/2013   Lab Results  Component Value Date   NA 140 02/01/2013   K 3.9 02/01/2013   CL 103 02/01/2013   CO2 26 02/01/2013   Lab Results  Component Value Date   ALT 38* 02/01/2013   AST 24 02/01/2013   ALKPHOS 114 02/01/2013   BILITOT 0.4 02/01/2013     NARRATIVE: Lori Randall was seen  today for weekly treatment management. The chart was checked and the patient's films were reviewed. The patient is doing well and completed her first week of treatment today. No difficulties.  PHYSICAL EXAMINATION: weight is 224 lb 8 oz (101.833 kg). Her oral temperature is 99.3 F (37.4 C). Her blood pressure is 133/69 and her pulse is 72. Her respiration is 20.        ASSESSMENT: The patient is doing satisfactorily with treatment.  PLAN: We will continue with the patient's radiation treatment as planned.

## 2013-03-18 ENCOUNTER — Ambulatory Visit
Admission: RE | Admit: 2013-03-18 | Discharge: 2013-03-18 | Disposition: A | Discharge status: Home / Self Care | Payer: BC Managed Care – PPO | Source: Ambulatory Visit | Attending: Radiation Oncology | Admitting: Radiation Oncology

## 2013-03-19 ENCOUNTER — Ambulatory Visit
Admission: RE | Admit: 2013-03-19 | Discharge: 2013-03-19 | Disposition: A | Discharge status: Home / Self Care | Payer: BC Managed Care – PPO | Source: Ambulatory Visit | Attending: Radiation Oncology | Admitting: Radiation Oncology

## 2013-03-22 ENCOUNTER — Ambulatory Visit
Admission: RE | Admit: 2013-03-22 | Discharge: 2013-03-22 | Disposition: A | Discharge status: Home / Self Care | Payer: BC Managed Care – PPO | Source: Ambulatory Visit | Attending: Radiation Oncology | Admitting: Radiation Oncology

## 2013-03-23 ENCOUNTER — Ambulatory Visit
Admission: RE | Admit: 2013-03-23 | Discharge: 2013-03-23 | Disposition: A | Discharge status: Home / Self Care | Payer: BC Managed Care – PPO | Source: Ambulatory Visit | Attending: Radiation Oncology | Admitting: Radiation Oncology

## 2013-03-23 VITALS — BP 150/71 | HR 74 | Temp 98.0°F | Ht 67.0 in | Wt 226.0 lb

## 2013-03-23 DIAGNOSIS — C50411 Malignant neoplasm of upper-outer quadrant of right female breast: Secondary | ICD-10-CM

## 2013-03-23 NOTE — Progress Notes (Signed)
Lori Randall here for weekly under treat visit.  She has had 9 fractions to her right breast.  She does have fatigue.  The skin on her right breast is intact and slightly pink.  She reports that it is itchy sometimes.  She is using radiaplex gel twice a day.

## 2013-03-23 NOTE — Progress Notes (Signed)
   Department of Radiation Oncology  Phone:  [phone] Fax:        [phone]  Weekly Treatment Note    Name: Lori Randall Date: 03/23/2013 MRN: [id number] DOB: July 07, 1951   Current dose: 16.2 Gy  Current fraction: 9   MEDICATIONS: Current Outpatient Prescriptions  Medication Sig Dispense Refill  . atorvastatin (LIPITOR) 80 MG tablet Take 80 mg by mouth daily.      . Cholecalciferol (VITAMIN D3) 2000 UNITS TABS Take 2,000 Units by mouth daily.      Marland Kitchen ezetimibe (ZETIA) 10 MG tablet Take 10 mg by mouth daily.      . fish oil-omega-3 fatty acids 1000 MG capsule Take 1 g by mouth daily.      . hyaluronate sodium (RADIAPLEXRX) GEL Apply 1 application topically 2 (two) times daily. Apply after rad and bedtime daily      . hydrochlorothiazide (HYDRODIURIL) 25 MG tablet Take 25 mg by mouth daily.      Marland Kitchen HYDROcodone-acetaminophen (NORCO/VICODIN) 5-325 MG per tablet Take 1-2 tablets by mouth every 4 (four) hours as needed for pain.  40 tablet  1  . meloxicam (MOBIC) 15 MG tablet Take 15 mg by mouth daily.      . non-metallic deodorant Thornton Papas) MISC Apply 1 application topically daily as needed.      . ramipril (ALTACE) 5 MG capsule Take 5 mg by mouth daily.      . promethazine (PHENERGAN) 25 MG tablet Take 1 tablet (25 mg total) by mouth every 4 (four) hours as needed for nausea.  15 tablet  0   No current facility-administered medications for this encounter.     ALLERGIES: Review of patient's allergies indicates no known allergies.   LABORATORY DATA:  Lab Results  Component Value Date   WBC 4.6 02/01/2013   HGB 13.9 02/01/2013   HCT 40.6 02/01/2013   MCV 90.2 02/01/2013   PLT 209 02/01/2013   Lab Results  Component Value Date   NA 140 02/01/2013   K 3.9 02/01/2013   CL 103 02/01/2013   CO2 26 02/01/2013   Lab Results  Component Value Date   ALT 38* 02/01/2013   AST 24 02/01/2013   ALKPHOS 114 02/01/2013   BILITOT 0.4 02/01/2013     NARRATIVE: Lori Randall was  seen today for weekly treatment management. The chart was checked and the patient's films were reviewed. The patient is doing very well with her treatment. She is using skin cream daily. A little bit of change in terms of itchiness in the treatment area but otherwise no differences.  PHYSICAL EXAMINATION: height is 5\' 7"  (1.702 m) and weight is 226 lb (102.513 kg). Her temperature is 98 F (36.7 C). Her blood pressure is 150/71 and her pulse is 74.      slight erythema present  ASSESSMENT: The patient is doing satisfactorily with treatment.  PLAN: We will continue with the patient's radiation treatment as planned.

## 2013-03-24 ENCOUNTER — Ambulatory Visit
Admission: RE | Admit: 2013-03-24 | Discharge: 2013-03-24 | Disposition: A | Discharge status: Home / Self Care | Payer: BC Managed Care – PPO | Source: Ambulatory Visit | Attending: Radiation Oncology | Admitting: Radiation Oncology

## 2013-03-25 ENCOUNTER — Encounter (INDEPENDENT_AMBULATORY_CARE_PROVIDER_SITE_OTHER): Payer: Self-pay | Admitting: General Surgery

## 2013-03-25 ENCOUNTER — Ambulatory Visit (INDEPENDENT_AMBULATORY_CARE_PROVIDER_SITE_OTHER): Payer: BC Managed Care – PPO | Admitting: General Surgery

## 2013-03-25 ENCOUNTER — Ambulatory Visit
Admission: RE | Admit: 2013-03-25 | Discharge: 2013-03-25 | Disposition: A | Discharge status: Home / Self Care | Payer: BC Managed Care – PPO | Source: Ambulatory Visit | Attending: Radiation Oncology | Admitting: Radiation Oncology

## 2013-03-25 VITALS — BP 120/70 | HR 72 | Temp 97.8°F | Resp 18 | Ht 67.0 in | Wt 225.0 lb

## 2013-03-25 DIAGNOSIS — C50411 Malignant neoplasm of upper-outer quadrant of right female breast: Secondary | ICD-10-CM

## 2013-03-25 DIAGNOSIS — C50419 Malignant neoplasm of upper-outer quadrant of unspecified female breast: Secondary | ICD-10-CM

## 2013-03-25 NOTE — Patient Instructions (Signed)
Your right breast and right axillary wounds have healed without any obvious surgical complication. Contours and cosmesis looked pretty good.  Complete your radiation therapy, and then see Dr. Darnelle Catalan to see if any further treatments are necessary.  Return to see Dr. Derrell Lolling in 4 months.

## 2013-03-25 NOTE — Progress Notes (Signed)
Patient ID: NORLENE LANES, female   DOB: 01-Sep-1950, 62 y.o.   MRN: [id number] History: This patient underwent right partial mastectomy and sentinel lymph node biopsy on 02/01/2013. Final pathology showed invasive ductal carcinoma, 2.1 cm, receptor positive, HER-2-negative. Pathologic stage T1c,N0. She has done well from the surgery. She has good range of motion right shoulder. No arm swelling or numbness. She is about 9 days into her radiation therapy.  Exam: Patient looks well. No distress. Right breast shows incision upper outer quadrant and right axilla have healed well. The tissues are soft. No seroma or hematoma. Complete range of motion right shoulder. No arm swelling  Assessment: Invasive ductal carcinoma right breast, upper outer quadrant, 2.1 cm, receptor positive, HER-2-negative, pathologic stage T1c, N0. Uneventful recovery following right partial mastectomy and sentinel node biopsy  Plan: Proceed with radiation therapy Follow up with medical oncology, Dr. Darnelle Catalan after radiation therapy Return to see me in 4 months.   Angelia Mould. Derrell Lolling, M.D., [facility] General and Minimally invasive Surgery Breast and Colorectal Surgery Office:   [phone] Pager:   [phone]

## 2013-03-26 ENCOUNTER — Ambulatory Visit
Admission: RE | Admit: 2013-03-26 | Discharge: 2013-03-26 | Disposition: A | Discharge status: Home / Self Care | Payer: BC Managed Care – PPO | Source: Ambulatory Visit | Attending: Radiation Oncology | Admitting: Radiation Oncology

## 2013-03-29 ENCOUNTER — Encounter: Payer: Self-pay | Admitting: Radiation Oncology

## 2013-03-29 ENCOUNTER — Ambulatory Visit
Admission: RE | Admit: 2013-03-29 | Discharge: 2013-03-29 | Disposition: A | Discharge status: Home / Self Care | Payer: BC Managed Care – PPO | Source: Ambulatory Visit | Attending: Radiation Oncology | Admitting: Radiation Oncology

## 2013-03-29 VITALS — BP 157/92 | HR 70 | Temp 98.0°F | Resp 20 | Wt 227.0 lb

## 2013-03-29 DIAGNOSIS — C50411 Malignant neoplasm of upper-outer quadrant of right female breast: Secondary | ICD-10-CM

## 2013-03-29 NOTE — Progress Notes (Signed)
   Department of Radiation Oncology  Phone:  [phone] Fax:        [phone]  Weekly Treatment Note    Name: Lori Randall Date: 03/29/2013 MRN: [id number] DOB: Dec 25, 1950   Current dose: 23.4 Gy  Current fraction: 13   MEDICATIONS: Current Outpatient Prescriptions  Medication Sig Dispense Refill  . atorvastatin (LIPITOR) 80 MG tablet Take 80 mg by mouth daily.      . Cholecalciferol (VITAMIN D3) 2000 UNITS TABS Take 2,000 Units by mouth daily.      Marland Kitchen ezetimibe (ZETIA) 10 MG tablet Take 10 mg by mouth daily.      . fish oil-omega-3 fatty acids 1000 MG capsule Take 1 g by mouth daily.      . hyaluronate sodium (RADIAPLEXRX) GEL Apply 1 application topically 2 (two) times daily. Apply after rad and bedtime daily      . hydrochlorothiazide (HYDRODIURIL) 25 MG tablet Take 25 mg by mouth daily.      Marland Kitchen HYDROcodone-acetaminophen (NORCO/VICODIN) 5-325 MG per tablet Take 1-2 tablets by mouth every 4 (four) hours as needed for pain.  40 tablet  1  . meloxicam (MOBIC) 15 MG tablet Take 15 mg by mouth daily.      . non-metallic deodorant Thornton Papas) MISC Apply 1 application topically daily as needed.      . promethazine (PHENERGAN) 25 MG tablet Take 1 tablet (25 mg total) by mouth every 4 (four) hours as needed for nausea.  15 tablet  0  . ramipril (ALTACE) 5 MG capsule Take 5 mg by mouth daily.       No current facility-administered medications for this encounter.     ALLERGIES: Review of patient's allergies indicates no known allergies.   LABORATORY DATA:  Lab Results  Component Value Date   WBC 4.6 02/01/2013   HGB 13.9 02/01/2013   HCT 40.6 02/01/2013   MCV 90.2 02/01/2013   PLT 209 02/01/2013   Lab Results  Component Value Date   NA 140 02/01/2013   K 3.9 02/01/2013   CL 103 02/01/2013   CO2 26 02/01/2013   Lab Results  Component Value Date   ALT 38* 02/01/2013   AST 24 02/01/2013   ALKPHOS 114 02/01/2013   BILITOT 0.4 02/01/2013     NARRATIVE: Lori Randall was  seen today for weekly treatment management. The chart was checked and the patient's films were reviewed. The patient is doing very well this week. She has noticed a little bit of rash. No real complaints otherwise. She is using skin cream daily.  PHYSICAL EXAMINATION: weight is 227 lb (102.967 kg). Her temperature is 98 F (36.7 C). Her blood pressure is 157/92 and her pulse is 70. Her respiration is 20.      slight erythema in the treatment area. Overall her skin looks very good.  ASSESSMENT: The patient is doing satisfactorily with treatment.  PLAN: We will continue with the patient's radiation treatment as planned.

## 2013-03-29 NOTE — Progress Notes (Signed)
Pt denies pain, loss of appetite. She is slightly fatigued. She is applying Radiaplex to right breast, has slight pinkness of area.

## 2013-03-30 ENCOUNTER — Ambulatory Visit
Admission: RE | Admit: 2013-03-30 | Discharge: 2013-03-30 | Disposition: A | Discharge status: Home / Self Care | Payer: BC Managed Care – PPO | Source: Ambulatory Visit | Attending: Radiation Oncology | Admitting: Radiation Oncology

## 2013-03-31 ENCOUNTER — Ambulatory Visit
Admission: RE | Admit: 2013-03-31 | Discharge: 2013-03-31 | Disposition: A | Discharge status: Home / Self Care | Payer: BC Managed Care – PPO | Source: Ambulatory Visit | Attending: Radiation Oncology | Admitting: Radiation Oncology

## 2013-04-01 ENCOUNTER — Ambulatory Visit
Admission: RE | Admit: 2013-04-01 | Discharge: 2013-04-01 | Disposition: A | Discharge status: Home / Self Care | Payer: BC Managed Care – PPO | Source: Ambulatory Visit | Attending: Radiation Oncology | Admitting: Radiation Oncology

## 2013-04-01 VITALS — BP 179/91 | HR 75 | Temp 98.0°F | Ht 67.0 in | Wt 226.2 lb

## 2013-04-01 DIAGNOSIS — C50411 Malignant neoplasm of upper-outer quadrant of right female breast: Secondary | ICD-10-CM

## 2013-04-01 MED ORDER — RADIAPLEXRX EX GEL
Freq: Once | CUTANEOUS | Status: AC
  Administered 2013-04-01: 17:00:00 via TOPICAL

## 2013-04-01 NOTE — Progress Notes (Signed)
   Department of Radiation Oncology  Phone:  [phone] Fax:        [phone]  Weekly Treatment Note    Name: Lori Randall Date: 04/01/2013 MRN: [id number] DOB: [birth year]   Current dose: 28.8 Gy  Current fraction: 16   MEDICATIONS: Current Outpatient Prescriptions  Medication Sig Dispense Refill  . atorvastatin (LIPITOR) 80 MG tablet Take 80 mg by mouth daily.      . Cholecalciferol (VITAMIN D3) 2000 UNITS TABS Take 2,000 Units by mouth daily.      Marland Kitchen ezetimibe (ZETIA) 10 MG tablet Take 10 mg by mouth daily.      . fish oil-omega-3 fatty acids 1000 MG capsule Take 1 g by mouth daily.      . hyaluronate sodium (RADIAPLEXRX) GEL Apply 1 application topically 2 (two) times daily. Apply after rad and bedtime daily      . hydrochlorothiazide (HYDRODIURIL) 25 MG tablet Take 25 mg by mouth daily.      . meloxicam (MOBIC) 15 MG tablet Take 15 mg by mouth daily.      . non-metallic deodorant Thornton Papas) MISC Apply 1 application topically daily as needed.      Marland Kitchen HYDROcodone-acetaminophen (NORCO/VICODIN) 5-325 MG per tablet Take 1-2 tablets by mouth every 4 (four) hours as needed for pain.  40 tablet  1  . promethazine (PHENERGAN) 25 MG tablet Take 1 tablet (25 mg total) by mouth every 4 (four) hours as needed for nausea.  15 tablet  0  . ramipril (ALTACE) 5 MG capsule Take 5 mg by mouth daily.       No current facility-administered medications for this encounter.     ALLERGIES: Review of patient's allergies indicates no known allergies.   LABORATORY DATA:  Lab Results  Component Value Date   WBC 4.6 02/01/2013   HGB 13.9 02/01/2013   HCT 40.6 02/01/2013   MCV 90.2 02/01/2013   PLT 209 02/01/2013   Lab Results  Component Value Date   NA 140 02/01/2013   K 3.9 02/01/2013   CL 103 02/01/2013   CO2 26 02/01/2013   Lab Results  Component Value Date   ALT 38* 02/01/2013   AST 24 02/01/2013   ALKPHOS 114 02/01/2013   BILITOT 0.4 02/01/2013     NARRATIVE: Lori Randall was  seen today for weekly treatment management. The chart was checked and the patient's films were reviewed. The patient is doing well overall. She is notice some irritation along the inferior aspect of the axillary incision. She noticed a little bit of yellow drainage today. This was the first time.  PHYSICAL EXAMINATION: height is 5\' 7"  (1.702 m) and weight is 226 lb 3.2 oz (102.604 kg). Her temperature is 98 F (36.7 C). Her blood pressure is 179/91 and her pulse is 75.      the patient's skin in the treatment area looks very good. Mild erythema. A very small leak of the axillary scar is slightly open. No active drainage today.  ASSESSMENT: The patient is doing satisfactorily with treatment.  PLAN: We will continue with the patient's radiation treatment as planned. The patient will apply some Neosporin to the incision in the area in question as noted above. She will also continue skin cream use about the treatment area as she has been doing.

## 2013-04-01 NOTE — Addendum Note (Signed)
Encounter addended by: Eduardo Osier, RN on: 04/01/2013  5:15 PM<BR>     Documentation filed: Inpatient MAR

## 2013-04-01 NOTE — Addendum Note (Signed)
Encounter addended by: Eduardo Osier, RN on: 04/01/2013 12:12 PM<BR>     Documentation filed: Notes Section, Orders

## 2013-04-01 NOTE — Progress Notes (Addendum)
Lori Randall here for weekly under treat visit.  She has had 16 fractions to her right breast.  She denies pain.  She does have fatigue.  The incision underneath her right arm is open on the bottom edge.  She reports that it is irritated and she has seen yellow drainage this morning.  She also has a scattered rash on her right chest.  The skin on her right breast is intact and pink.  She is using radiaplex gel twice a day.  She requested a refill and another tube has been given.

## 2013-04-02 ENCOUNTER — Ambulatory Visit
Admission: RE | Admit: 2013-04-02 | Discharge: 2013-04-02 | Disposition: A | Discharge status: Home / Self Care | Payer: BC Managed Care – PPO | Source: Ambulatory Visit | Attending: Radiation Oncology | Admitting: Radiation Oncology

## 2013-04-06 ENCOUNTER — Ambulatory Visit
Admission: RE | Admit: 2013-04-06 | Discharge: 2013-04-06 | Disposition: A | Discharge status: Home / Self Care | Payer: BC Managed Care – PPO | Source: Ambulatory Visit | Attending: Radiation Oncology | Admitting: Radiation Oncology

## 2013-04-07 ENCOUNTER — Ambulatory Visit
Admission: RE | Admit: 2013-04-07 | Discharge: 2013-04-07 | Disposition: A | Discharge status: Home / Self Care | Payer: BC Managed Care – PPO | Source: Ambulatory Visit | Attending: Radiation Oncology | Admitting: Radiation Oncology

## 2013-04-07 DIAGNOSIS — C50411 Malignant neoplasm of upper-outer quadrant of right female breast: Secondary | ICD-10-CM

## 2013-04-08 ENCOUNTER — Ambulatory Visit
Admission: RE | Admit: 2013-04-08 | Discharge: 2013-04-08 | Disposition: A | Discharge status: Home / Self Care | Payer: BC Managed Care – PPO | Source: Ambulatory Visit | Attending: Radiation Oncology | Admitting: Radiation Oncology

## 2013-04-09 ENCOUNTER — Ambulatory Visit
Admission: RE | Admit: 2013-04-09 | Discharge: 2013-04-09 | Disposition: A | Discharge status: Home / Self Care | Payer: BC Managed Care – PPO | Source: Ambulatory Visit | Attending: Radiation Oncology | Admitting: Radiation Oncology

## 2013-04-09 ENCOUNTER — Encounter: Payer: Self-pay | Admitting: Radiation Oncology

## 2013-04-09 DIAGNOSIS — C50411 Malignant neoplasm of upper-outer quadrant of right female breast: Secondary | ICD-10-CM

## 2013-04-09 NOTE — Progress Notes (Signed)
   Department of Radiation Oncology  Phone:  [phone] Fax:        [phone]  Weekly Treatment Note    Name: Lori Randall Date: 04/09/2013 MRN: [id number] DOB: 1951-02-16   Current dose: 37.8 Gy  Current fraction: 21   MEDICATIONS: Current Outpatient Prescriptions  Medication Sig Dispense Refill  . atorvastatin (LIPITOR) 80 MG tablet Take 80 mg by mouth daily.      . Cholecalciferol (VITAMIN D3) 2000 UNITS TABS Take 2,000 Units by mouth daily.      Marland Kitchen ezetimibe (ZETIA) 10 MG tablet Take 10 mg by mouth daily.      . fish oil-omega-3 fatty acids 1000 MG capsule Take 1 g by mouth daily.      . hyaluronate sodium (RADIAPLEXRX) GEL Apply 1 application topically 2 (two) times daily. Apply after rad and bedtime daily      . hydrochlorothiazide (HYDRODIURIL) 25 MG tablet Take 25 mg by mouth daily.      Marland Kitchen HYDROcodone-acetaminophen (NORCO/VICODIN) 5-325 MG per tablet Take 1-2 tablets by mouth every 4 (four) hours as needed for pain.  40 tablet  1  . meloxicam (MOBIC) 15 MG tablet Take 15 mg by mouth daily.      . non-metallic deodorant Thornton Papas) MISC Apply 1 application topically daily as needed.      . promethazine (PHENERGAN) 25 MG tablet Take 1 tablet (25 mg total) by mouth every 4 (four) hours as needed for nausea.  15 tablet  0  . ramipril (ALTACE) 5 MG capsule Take 5 mg by mouth daily.       No current facility-administered medications for this encounter.     ALLERGIES: Review of patient's allergies indicates no known allergies.   LABORATORY DATA:  Lab Results  Component Value Date   WBC 4.6 02/01/2013   HGB 13.9 02/01/2013   HCT 40.6 02/01/2013   MCV 90.2 02/01/2013   PLT 209 02/01/2013   Lab Results  Component Value Date   NA 140 02/01/2013   K 3.9 02/01/2013   CL 103 02/01/2013   CO2 26 02/01/2013   Lab Results  Component Value Date   ALT 38* 02/01/2013   AST 24 02/01/2013   ALKPHOS 114 02/01/2013   BILITOT 0.4 02/01/2013     NARRATIVE: Lori Randall was  seen today for weekly treatment management. The chart was checked and the patient's films were reviewed. The patient is doing fairly well with treatment. Some skin irritation and the right axilla.  PHYSICAL EXAMINATION: vitals were not taken for this visit.     overall the patient's skin looks quite good. Hyperpigmentation most prominent in the right axilla. Boost site looks fine in terms of skin irritation.  ASSESSMENT: The patient is doing satisfactorily with treatment.  PLAN: We will continue with the patient's radiation treatment as planned.

## 2013-04-12 ENCOUNTER — Encounter: Payer: Self-pay | Admitting: *Deleted

## 2013-04-12 ENCOUNTER — Ambulatory Visit
Admission: RE | Admit: 2013-04-12 | Discharge: 2013-04-12 | Disposition: A | Discharge status: Home / Self Care | Payer: BC Managed Care – PPO | Source: Ambulatory Visit | Attending: Radiation Oncology | Admitting: Radiation Oncology

## 2013-04-12 NOTE — Progress Notes (Signed)
Faxed office notes to Dondra Prader at [facility] for medical necessity.

## 2013-04-13 ENCOUNTER — Ambulatory Visit
Admission: RE | Admit: 2013-04-13 | Discharge: 2013-04-13 | Disposition: A | Discharge status: Home / Self Care | Payer: BC Managed Care – PPO | Source: Ambulatory Visit | Attending: Radiation Oncology | Admitting: Radiation Oncology

## 2013-04-14 ENCOUNTER — Ambulatory Visit
Admission: RE | Admit: 2013-04-14 | Discharge: 2013-04-14 | Disposition: A | Discharge status: Home / Self Care | Payer: BC Managed Care – PPO | Source: Ambulatory Visit | Attending: Radiation Oncology | Admitting: Radiation Oncology

## 2013-04-15 ENCOUNTER — Ambulatory Visit
Admission: RE | Admit: 2013-04-15 | Discharge: 2013-04-15 | Disposition: A | Discharge status: Home / Self Care | Payer: BC Managed Care – PPO | Source: Ambulatory Visit | Attending: Radiation Oncology | Admitting: Radiation Oncology

## 2013-04-16 ENCOUNTER — Ambulatory Visit
Admission: RE | Admit: 2013-04-16 | Discharge: 2013-04-16 | Disposition: A | Discharge status: Home / Self Care | Payer: BC Managed Care – PPO | Source: Ambulatory Visit | Attending: Radiation Oncology | Admitting: Radiation Oncology

## 2013-04-16 ENCOUNTER — Encounter: Payer: Self-pay | Admitting: Radiation Oncology

## 2013-04-16 VITALS — BP 146/80 | HR 71 | Temp 98.2°F | Resp 20 | Wt 225.9 lb

## 2013-04-16 DIAGNOSIS — C50411 Malignant neoplasm of upper-outer quadrant of right female breast: Secondary | ICD-10-CM

## 2013-04-16 MED ORDER — BIAFINE EX EMUL
Freq: Two times a day (BID) | CUTANEOUS | Status: DC
  Administered 2013-04-16: 10:00:00 via TOPICAL

## 2013-04-16 NOTE — Progress Notes (Signed)
Complex simulation note  Diagnosis: Breast cancer  Narrative The patient has initially been planned to receive a course of whole breast radiation to a dose of 45 gray in 25 fractions at 1.8 gray per fraction. The patient will now receive an additional boost to the seroma cavity which has been contoured. This will correspond to a boost of 16 gray in 8 fractions at 2 gray per fraction. To accomplish this, an additional 3 customized blocks have been designed for this purpose. A complex isodose plan is requested to ensure that the target area is adequately covered with radiation dose and that the nearby normal structures such as the lung are adequately spared. The patient's final total dose will be 61 gray.  ------------------------------------------------  Gurney Balthazor S. Yanelle Sousa, MD, PhD  

## 2013-04-16 NOTE — Progress Notes (Signed)
    Department of Radiation Oncology  Phone:  [phone] Fax:        [phone]  Weekly Treatment Note    Name: Lori Randall Date: 04/16/2013 MRN: [id number] DOB: [birth year]   Current dose: 47 Gy  Current fraction: 26   MEDICATIONS: Current Outpatient Prescriptions  Medication Sig Dispense Refill  . atorvastatin (LIPITOR) 80 MG tablet Take 80 mg by mouth daily.      . Cholecalciferol (VITAMIN D3) 2000 UNITS TABS Take 2,000 Units by mouth daily.      . Emollient (BIAFINE WOUND DRESSING EX) Apply topically.      Marland Kitchen ezetimibe (ZETIA) 10 MG tablet Take 10 mg by mouth daily.      . fish oil-omega-3 fatty acids 1000 MG capsule Take 1 g by mouth daily.      . hyaluronate sodium (RADIAPLEXRX) GEL Apply 1 application topically 2 (two) times daily. Apply after rad and bedtime daily      . hydrochlorothiazide (HYDRODIURIL) 25 MG tablet Take 25 mg by mouth daily.      Marland Kitchen HYDROcodone-acetaminophen (NORCO/VICODIN) 5-325 MG per tablet Take 1-2 tablets by mouth every 4 (four) hours as needed for pain.  40 tablet  1  . meloxicam (MOBIC) 15 MG tablet Take 15 mg by mouth daily.      . non-metallic deodorant Thornton Papas) MISC Apply 1 application topically daily as needed.      . promethazine (PHENERGAN) 25 MG tablet Take 1 tablet (25 mg total) by mouth every 4 (four) hours as needed for nausea.  15 tablet  0  . ramipril (ALTACE) 5 MG capsule Take 5 mg by mouth daily.       Current Facility-Administered Medications  Medication Dose Route Frequency Provider Last Rate Last Dose  . topical emolient (BIAFINE) emulsion   Topical BID Jonna Coup, MD         ALLERGIES: Review of patient's allergies indicates no known allergies.   LABORATORY DATA:  Lab Results  Component Value Date   WBC 4.6 02/01/2013   HGB 13.9 02/01/2013   HCT 40.6 02/01/2013   MCV 90.2 02/01/2013   PLT 209 02/01/2013   Lab Results  Component Value Date   NA 140 02/01/2013   K 3.9 02/01/2013   CL 103 02/01/2013   CO2  26 02/01/2013   Lab Results  Component Value Date   ALT 38* 02/01/2013   AST 24 02/01/2013   ALKPHOS 114 02/01/2013   BILITOT 0.4 02/01/2013     NARRATIVE: Lori Randall was seen today for weekly treatment management. The chart was checked and the patient's films were reviewed. The patient states that she is doing fairly well. She is fatigued. She complains of some achiness in the treatment area.  PHYSICAL EXAMINATION: weight is 225 lb 14.4 oz (102.468 kg). Her temperature is 98.2 F (36.8 C). Her blood pressure is 146/80 and her pulse is 71. Her respiration is 20.      the patient's skin looks good overall. Clear radiation change. A couple of areas of hyperpigmentation as well as dermatitis.  ASSESSMENT: The patient is doing satisfactorily with treatment.  PLAN: We will continue with the patient's radiation treatment as planned. She will begin using Biafine cream and also will use hydrocortisone cream on specific areas as needed for pruritus.

## 2013-04-16 NOTE — Progress Notes (Signed)
Pt denies pain, loss of appetite. She is fatigued. Pt c/o itching in upper right treatment area, right axilla, under right breast. She appears to have impending moist desquamation under right breast; advised she apply antibiotic ointment if this area begins to peel over weekend. Gave pt Biafine for itching.

## 2013-04-16 NOTE — Addendum Note (Signed)
Encounter addended by: Glennie Hawk, RN on: 04/16/2013 10:19 AM<BR>     Documentation filed: Inpatient MAR

## 2013-04-19 ENCOUNTER — Ambulatory Visit
Admission: RE | Admit: 2013-04-19 | Discharge: 2013-04-19 | Disposition: A | Discharge status: Home / Self Care | Payer: BC Managed Care – PPO | Source: Ambulatory Visit | Attending: Radiation Oncology | Admitting: Radiation Oncology

## 2013-04-20 ENCOUNTER — Ambulatory Visit
Admission: RE | Admit: 2013-04-20 | Discharge: 2013-04-20 | Disposition: A | Discharge status: Home / Self Care | Payer: BC Managed Care – PPO | Source: Ambulatory Visit | Attending: Radiation Oncology | Admitting: Radiation Oncology

## 2013-04-21 ENCOUNTER — Ambulatory Visit
Admission: RE | Admit: 2013-04-21 | Discharge: 2013-04-21 | Disposition: A | Discharge status: Home / Self Care | Payer: BC Managed Care – PPO | Source: Ambulatory Visit | Attending: Radiation Oncology | Admitting: Radiation Oncology

## 2013-04-22 ENCOUNTER — Ambulatory Visit
Admission: RE | Admit: 2013-04-22 | Discharge: 2013-04-22 | Disposition: A | Discharge status: Home / Self Care | Payer: BC Managed Care – PPO | Source: Ambulatory Visit | Attending: Radiation Oncology | Admitting: Radiation Oncology

## 2013-04-23 ENCOUNTER — Ambulatory Visit
Admission: RE | Admit: 2013-04-23 | Discharge: 2013-04-23 | Disposition: A | Discharge status: Home / Self Care | Payer: BC Managed Care – PPO | Source: Ambulatory Visit | Attending: Radiation Oncology | Admitting: Radiation Oncology

## 2013-04-23 ENCOUNTER — Encounter: Payer: Self-pay | Admitting: Radiation Oncology

## 2013-04-23 VITALS — BP 151/78 | HR 77 | Temp 98.6°F | Resp 20 | Wt 226.6 lb

## 2013-04-23 DIAGNOSIS — C50411 Malignant neoplasm of upper-outer quadrant of right female breast: Secondary | ICD-10-CM

## 2013-04-23 MED ORDER — BIAFINE EX EMUL
CUTANEOUS | Status: DC | PRN
  Administered 2013-04-23: 11:00:00 via TOPICAL

## 2013-04-23 NOTE — Addendum Note (Signed)
Encounter addended by: Lowella Petties, RN on: 04/23/2013 10:38 AM<BR>     Documentation filed: Orders

## 2013-04-23 NOTE — Progress Notes (Signed)
   Department of Radiation Oncology  Phone:  [phone] Fax:        [phone]  Weekly Treatment Note    Name: Lori Randall Date: 04/23/2013 MRN: [id number] DOB: 01-20-1951   Current dose: 57 Gy  Current fraction: 31   MEDICATIONS: Current Outpatient Prescriptions  Medication Sig Dispense Refill  . atorvastatin (LIPITOR) 80 MG tablet Take 80 mg by mouth daily.      . Cholecalciferol (VITAMIN D3) 2000 UNITS TABS Take 2,000 Units by mouth daily.      . Emollient (BIAFINE WOUND DRESSING EX) Apply topically.      Marland Kitchen ezetimibe (ZETIA) 10 MG tablet Take 10 mg by mouth daily.      . fish oil-omega-3 fatty acids 1000 MG capsule Take 1 g by mouth daily.      . hyaluronate sodium (RADIAPLEXRX) GEL Apply 1 application topically 2 (two) times daily. Apply after rad and bedtime daily      . hydrochlorothiazide (HYDRODIURIL) 25 MG tablet Take 25 mg by mouth daily.      Marland Kitchen HYDROcodone-acetaminophen (NORCO/VICODIN) 5-325 MG per tablet Take 1-2 tablets by mouth every 4 (four) hours as needed for pain.  40 tablet  1  . meloxicam (MOBIC) 15 MG tablet Take 15 mg by mouth daily.      . non-metallic deodorant Thornton Papas) MISC Apply 1 application topically daily as needed.      . promethazine (PHENERGAN) 25 MG tablet Take 1 tablet (25 mg total) by mouth every 4 (four) hours as needed for nausea.  15 tablet  0  . ramipril (ALTACE) 5 MG capsule Take 5 mg by mouth daily.       No current facility-administered medications for this encounter.     ALLERGIES: Review of patient's allergies indicates no known allergies.   LABORATORY DATA:  Lab Results  Component Value Date   WBC 4.6 02/01/2013   HGB 13.9 02/01/2013   HCT 40.6 02/01/2013   MCV 90.2 02/01/2013   PLT 209 02/01/2013   Lab Results  Component Value Date   NA 140 02/01/2013   K 3.9 02/01/2013   CL 103 02/01/2013   CO2 26 02/01/2013   Lab Results  Component Value Date   ALT 38* 02/01/2013   AST 24 02/01/2013   ALKPHOS 114 02/01/2013   BILITOT 0.4 02/01/2013     NARRATIVE: Lori Randall was seen today for weekly treatment management. The chart was checked and the patient's films were reviewed. The patient is doing well. Some continued skin irritation with some burning and itching.  PHYSICAL EXAMINATION: weight is 226 lb 9.6 oz (102.785 kg). Her oral temperature is 98.6 F (37 C). Her blood pressure is 151/78 and her pulse is 77. Her respiration is 20.      diffuse erythema with a couple of areas of dry desquamation. Overall her skin looks satisfactorily for nearing completion of her treatment.  ASSESSMENT: The patient is doing satisfactorily with treatment.  PLAN: We will continue with the patient's radiation treatment as planned. The patient was given additional Biafine cream. She will followup with me in 1 month.

## 2013-04-23 NOTE — Progress Notes (Signed)
Weekly rad txs, 31/33 right breast, bright erythema ,under inframmay fold ans axilla peeling, dry desquamation, gave another biafine cream, almost out, and c/o burning and itching, gave f/u 1 mon appt card, offered [facility] flyer, patienr declined, I live in [place] far to drive" 1:61 AM

## 2013-04-23 NOTE — Addendum Note (Signed)
Encounter addended by: Lowella Petties, RN on: 04/23/2013 10:40 AM<BR>     Documentation filed: Inpatient MAR

## 2013-04-26 ENCOUNTER — Ambulatory Visit
Admission: RE | Admit: 2013-04-26 | Discharge: 2013-04-26 | Disposition: A | Discharge status: Home / Self Care | Payer: BC Managed Care – PPO | Source: Ambulatory Visit | Attending: Radiation Oncology | Admitting: Radiation Oncology

## 2013-04-27 ENCOUNTER — Encounter: Payer: Self-pay | Admitting: Radiation Oncology

## 2013-04-27 ENCOUNTER — Ambulatory Visit
Admission: RE | Admit: 2013-04-27 | Discharge: 2013-04-27 | Disposition: A | Discharge status: Home / Self Care | Payer: BC Managed Care – PPO | Source: Ambulatory Visit | Attending: Radiation Oncology | Admitting: Radiation Oncology

## 2013-05-04 ENCOUNTER — Encounter (INDEPENDENT_AMBULATORY_CARE_PROVIDER_SITE_OTHER): Payer: Self-pay

## 2013-05-05 NOTE — Progress Notes (Signed)
  Radiation Oncology         [phone] ________________________________  Name: Lori Randall MRN: [id number]  Date: 04/07/2013  DOB: [birth year]  Simulation Verification Note   NARRATIVE: The patient was brought to the treatment unit and placed in the planned treatment position for the patient's boost treatment. The clinical setup was verified. Then port films were obtained and uploaded to the radiation oncology medical record software.  The treatment beams were carefully compared against the planned radiation fields. The position, location, and shape of the radiation fields was reviewed. The targeted volume of tissue appears to be appropriately covered by the radiation beams. Based on my personal review, I approved the simulation verification. The patient's treatment will proceed as planned.  ________________________________   Radene Gunning, MD, PhD

## 2013-05-05 NOTE — Addendum Note (Signed)
Encounter addended by: Jonna Coup, MD on: 05/05/2013 10:18 AM<BR>     Documentation filed: Visit Diagnoses, Notes Section, Follow-up Section, LOS Section

## 2013-05-05 NOTE — Progress Notes (Signed)
  Radiation Oncology         [phone] ________________________________  Name: Lori Randall MRN: [id number]  Date: 04/27/2013  DOB: 08/25/1950  End of Treatment Note  Diagnosis:   Invasive ductal carcinoma of the right breast     Indication for treatment:  Curative       Radiation treatment dates:   03/11/2013 through 04/27/2013  Site/dose:   The patient was treated with a course of whole breast radiotherapy on the right. This was initially accomplished using a prone 2 field technique. She received 45 gray in this fashion. The patient then received a boost for an additional 16 gray for a total of 61 gray. The boost treatment was delivered using a 3 field technique.  Narrative: The patient tolerated radiation treatment relatively well.   The patient had moderate skin irritation towards the end of treatment.  Plan: The patient has completed radiation treatment. The patient will return to radiation oncology clinic for routine followup in one month. I advised the patient to call or return sooner if they have any questions or concerns related to their recovery or treatment. ________________________________  Radene Gunning, M.D., Ph.D.

## 2013-05-11 ENCOUNTER — Encounter: Payer: Self-pay | Admitting: Radiation Oncology

## 2013-05-20 ENCOUNTER — Encounter: Payer: Self-pay | Admitting: Radiation Oncology

## 2013-05-20 ENCOUNTER — Ambulatory Visit
Admission: RE | Admit: 2013-05-20 | Discharge: 2013-05-20 | Disposition: A | Discharge status: Home / Self Care | Payer: BC Managed Care – PPO | Source: Ambulatory Visit | Attending: Radiation Oncology | Admitting: Radiation Oncology

## 2013-05-20 VITALS — BP 132/76 | HR 102 | Temp 98.8°F | Resp 20 | Wt 226.6 lb

## 2013-05-20 DIAGNOSIS — C50411 Malignant neoplasm of upper-outer quadrant of right female breast: Secondary | ICD-10-CM

## 2013-05-20 HISTORY — DX: Personal history of irradiation: Z92.3

## 2013-05-20 HISTORY — DX: Malignant neoplasm of unspecified site of unspecified female breast: C50.919

## 2013-05-20 NOTE — Progress Notes (Signed)
Radiation Oncology         [phone] ________________________________  Name: Lori Randall MRN: [id number]  Date: 05/20/2013  DOB: 06-25-1951  Follow-Up Visit Note  CC: Astrid Divine, MD  Ernestene Mention, MD  Diagnosis:   Right-sided breast cancer  Interval Since Last Radiation:  Approximately one month   Narrative:  The patient returns today for routine follow-up.  She has done well overall since she finished treatment. The patient's skin has healed significantly since she completed her course of radiation treatment. She has not begun anti-hormonal treatment.  She is scheduled to see medical oncology next week                            ALLERGIES:  has No Known Allergies.  Meds: Current Outpatient Prescriptions  Medication Sig Dispense Refill  . atorvastatin (LIPITOR) 80 MG tablet Take 80 mg by mouth daily.      . Cholecalciferol (VITAMIN D3) 2000 UNITS TABS Take 2,000 Units by mouth daily.      Marland Kitchen ezetimibe (ZETIA) 10 MG tablet Take 10 mg by mouth daily.      . fish oil-omega-3 fatty acids 1000 MG capsule Take 1 g by mouth daily.      . hydrochlorothiazide (HYDRODIURIL) 25 MG tablet Take 25 mg by mouth daily.      . meloxicam (MOBIC) 15 MG tablet Take 15 mg by mouth daily.      . ramipril (ALTACE) 5 MG capsule Take 5 mg by mouth daily.      . Emollient (BIAFINE WOUND DRESSING EX) Apply topically.      . hyaluronate sodium (RADIAPLEXRX) GEL Apply 1 application topically 2 (two) times daily. Apply after rad and bedtime daily      . non-metallic deodorant (ALRA) MISC Apply 1 application topically daily as needed.      . promethazine (PHENERGAN) 25 MG tablet Take 1 tablet (25 mg total) by mouth every 4 (four) hours as needed for nausea.  15 tablet  0   No current facility-administered medications for this encounter.    Physical Findings: The patient is in no acute distress. Patient is alert and oriented.  weight is 226 lb 9.6 oz (102.785 kg). Her oral temperature  is 98.8 F (37.1 C). Her blood pressure is 132/76 and her pulse is 102. Her respiration is 20. .   The skin in the treatment area has healed satisfactorily, no areas of concern/moist desquamation/poor healing  Lab Findings: Lab Results  Component Value Date   WBC 4.6 02/01/2013   HGB 13.9 02/01/2013   HCT 40.6 02/01/2013   MCV 90.2 02/01/2013   PLT 209 02/01/2013     Radiographic Findings: No results found.  Impression:    The patient has done satisfactorily since finishing treatment. She has not begun anti-hormonal treatment.  Plan:  The patient will followup in our clinic on a when necessary basis.   Radene Gunning, M.D., Ph.D.

## 2013-05-20 NOTE — Progress Notes (Signed)
F/u right breast rad txs:03/11/13-04/27/13, well healed, under inframmary fold one area raw looking but healing nicely, stillgets fatigued,  Having hot flashes again, no c/o pain 2:08 PM

## 2013-05-25 ENCOUNTER — Telehealth: Payer: Self-pay | Admitting: *Deleted

## 2013-05-25 ENCOUNTER — Ambulatory Visit (HOSPITAL_BASED_OUTPATIENT_CLINIC_OR_DEPARTMENT_OTHER): Payer: BC Managed Care – PPO | Admitting: Oncology

## 2013-05-25 VITALS — BP 136/81 | HR 80 | Temp 98.2°F | Resp 20 | Ht 67.0 in | Wt 224.7 lb

## 2013-05-25 DIAGNOSIS — Z170 Estrogen receptor positive status [ER+]: Secondary | ICD-10-CM

## 2013-05-25 DIAGNOSIS — C50411 Malignant neoplasm of upper-outer quadrant of right female breast: Secondary | ICD-10-CM | POA: Insufficient documentation

## 2013-05-25 DIAGNOSIS — C50419 Malignant neoplasm of upper-outer quadrant of unspecified female breast: Secondary | ICD-10-CM

## 2013-05-25 MED ORDER — ANASTROZOLE 1 MG PO TABS: 1.0000 mg | ORAL_TABLET | Freq: Every day | ORAL | Status: DC

## 2013-05-25 NOTE — Progress Notes (Signed)
ID: Lori Randall OB: [birth year]  MR#: [id number]  VHQ#:[id number]  PCP: Lori Divine, MD GYN:   SU: Lori Randall OTHER MD: Lori Randall, Lori Randall, Lori Randall   HISTORY OF PRESENT ILLNESS: "Lori Randall" had routine screening mammography at [facility] 01/11/2013 showing a potential abnormality in the left breast. Additional views 01/13/2013 found that the calcifications noted in the left breast were likely benign. However on the right a previously noted mass was now irregular in contour. There were also some associated calcifications. A right breast ultrasound found a hypoechoic mass collar than wide measuring 1.2 cm. Biopsy of this mass the same day ([id number]) showed an invasive ductal carcinoma, grade 1, estrogen receptor 100% positive, progesterone receptor 86% positive, with an MIB-1 of 14% and no HER-2 amplification.  Bilateral breast MRIs 01/25/2013 showed a 2.1 cm lobulated enhancing mass in the posterior third of the upper outer quadrant of the right breast there were no other areas of concern in either breast and no enlarged axillary or internal mammary adenopathy.  The patient's subsequent history is as detailed below   INTERVAL HISTORY: Lori Randall returns today for followup of her breast cancer. Since her last visit here she completed her radiation treatments. She had some desquamation, which has healed up. She still somewhat fatigued from the treatments.  REVIEW OF SYSTEMS: Even though physically she still feels she is "a few steps behind", the emotionally she is fine. She has a very positive attitude. She takes walks up to 30 minutes at a time, although not very frequently. She has had no unusual headaches, visual changes, cough, phlegm production, pleurisy, shortness of breath, chest pain or pressure, or change in bladder or bowel habits. She does have hot flashes, which cause sweating, perhaps twice daily. She wears a CPAP mask at night which is uncomfortable,  but hot flashes do not wake her up at night. A detailed review of systems today was otherwise noncontributory  PAST MEDICAL HISTORY: Past Medical History  Diagnosis Date  . Hypertension   . Arthritis   . Sleep apnea   . Hx of radiation therapy 03/11/13-04/27/13  . Breast cancer 01/13/13    right breast bx=invasive ca     PAST SURGICAL HISTORY: Past Surgical History  Procedure Laterality Date  . Appendectomy    . Excisional biopsy right breast    . Partial mastectomy with needle localization and axillary sentinel lymph node bx Right 02/01/2013    Procedure: PARTIAL MASTECTOMY WITH NEEDLE LOCALIZATION AND AXILLARY SENTINEL LYMPH NODE BX;  Surgeon: Lori Mention, MD;  Location: [facility] OR;  Service: General;  Laterality: Right;  needle localization at 7:30 [facility] nuclear medicine 30 minutes prior to surgery right breast 9:30  . Breast surgery      FAMILY HISTORY No family history on file. The patient's father died at the age of 30 with congestive 4 Lori Randall in the setting of severe dementia. The patient's mother died at age 3 with atypical parkinsonism. The patient has 3 brothers and 2 sisters. There is no history of breast or ovarian cancer in the family  GYNECOLOGIC HISTORY:  Menarche age 34, first live birth age 67. The patient is GX P2. She went through menopause approximately 2004. She took hormone replacement approximately 2 years. She took birth control remotely for approximately 10 years, without complications.  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 Her husband Lori Randall") M. Multimedia programmer used to work for [employer]. He is now retired. Daughter Lori Randall  is a paramedic in [facility]. Son Lori Randall is a landscaper in [place]. The patient has no grandchildren. She attends a [employer]    ADVANCED DIRECTIVES: In place.   HEALTH MAINTENANCE: History  Substance Use Topics  . Smoking status: Never Smoker   . Smokeless tobacco: Not on  file  . Alcohol Use: No     Comment: occasional     Colonoscopy: 2005  PAP: 2012  Bone density:  Lipid panel:  No Known Allergies  Current Outpatient Prescriptions  Medication Sig Dispense Refill  . atorvastatin (LIPITOR) 80 MG tablet Take 80 mg by mouth daily.      . Cholecalciferol (VITAMIN D3) 2000 UNITS TABS Take 2,000 Units by mouth daily.      . Emollient (BIAFINE WOUND DRESSING EX) Apply topically.      Marland Kitchen ezetimibe (ZETIA) 10 MG tablet Take 10 mg by mouth daily.      . fish oil-omega-3 fatty acids 1000 MG capsule Take 1 g by mouth daily.      . hyaluronate sodium (RADIAPLEXRX) GEL Apply 1 application topically 2 (two) times daily. Apply after rad and bedtime daily      . hydrochlorothiazide (HYDRODIURIL) 25 MG tablet Take 25 mg by mouth daily.      . meloxicam (MOBIC) 15 MG tablet Take 15 mg by mouth daily.      . non-metallic deodorant Thornton Papas) MISC Apply 1 application topically daily as needed.      . promethazine (PHENERGAN) 25 MG tablet Take 1 tablet (25 mg total) by mouth every 4 (four) hours as needed for nausea.  15 tablet  0  . ramipril (ALTACE) 5 MG capsule Take 5 mg by mouth daily.       No current facility-administered medications for this visit.    OBJECTIVE: Middle-aged white woman in no acute distress Filed Vitals:   05/25/13 1200  BP: 136/81  Pulse: 80  Temp: 98.2 F (36.8 C)  Resp: 20     Body mass index is 35.18 kg/(m^2).    ECOG FS: 1  Sclerae unicteric, pupils equal round and reactive to light Oropharynx no thrush or other lesions No cervical or supraclavicular adenopathy Lungs no rales or rhonchi Heart regular rate and rhythm Abd obese, soft, nontender, positive bowel sounds MSK no focal spinal tenderness, no upper extremity lymphedema Neuro: non-focal, well-oriented, positive affect Breasts: The right breast is status post lumpectomy and radiation There is no evidence of residual or recurrent disease. The right axilla is benign. The left breast  is unremarkable.   LAB RESULTS:  CMP     Component Value Date/Time   NA 140 02/01/2013 0931   NA 142 01/27/2013 1208   K 3.9 02/01/2013 0931   K 3.8 01/27/2013 1208   CL 103 02/01/2013 0931   CL 105 01/27/2013 1208   CO2 26 02/01/2013 0931   CO2 27 01/27/2013 1208   GLUCOSE 109* 02/01/2013 0931   GLUCOSE 123* 01/27/2013 1208   BUN 13 02/01/2013 0931   BUN 14.2 01/27/2013 1208   CREATININE 0.61 02/01/2013 0931   CREATININE 0.8 01/27/2013 1208   CALCIUM 10.5 02/01/2013 0931   CALCIUM 11.3* 01/27/2013 1208   PROT 7.4 02/01/2013 0931   PROT 7.5 01/27/2013 1208   ALBUMIN 4.3 02/01/2013 0931   ALBUMIN 4.1 01/27/2013 1208   AST 24 02/01/2013 0931   AST 26 01/27/2013 1208   ALT 38* 02/01/2013 0931   ALT 39 01/27/2013 1208   ALKPHOS  114 02/01/2013 0931   ALKPHOS 115 01/27/2013 1208   BILITOT 0.4 02/01/2013 0931   BILITOT 0.47 01/27/2013 1208   GFRNONAA >90 02/01/2013 0931   GFRAA >90 02/01/2013 0931    I No results found for this basename: SPEP,  UPEP,   kappa and lambda light chains    Lab Results  Component Value Date   WBC 4.6 02/01/2013   NEUTROABS 2.8 02/01/2013   HGB 13.9 02/01/2013   HCT 40.6 02/01/2013   MCV 90.2 02/01/2013   PLT 209 02/01/2013      Chemistry      Component Value Date/Time   NA 140 02/01/2013 0931   NA 142 01/27/2013 1208   K 3.9 02/01/2013 0931   K 3.8 01/27/2013 1208   CL 103 02/01/2013 0931   CL 105 01/27/2013 1208   CO2 26 02/01/2013 0931   CO2 27 01/27/2013 1208   BUN 13 02/01/2013 0931   BUN 14.2 01/27/2013 1208   CREATININE 0.61 02/01/2013 0931   CREATININE 0.8 01/27/2013 1208      Component Value Date/Time   CALCIUM 10.5 02/01/2013 0931   CALCIUM 11.3* 01/27/2013 1208   ALKPHOS 114 02/01/2013 0931   ALKPHOS 115 01/27/2013 1208   AST 24 02/01/2013 0931   AST 26 01/27/2013 1208   ALT 38* 02/01/2013 0931   ALT 39 01/27/2013 1208   BILITOT 0.4 02/01/2013 0931   BILITOT 0.47 01/27/2013 1208       No results found for this basename: LABCA2    No components found with  this basename: LABCA125    No results found for this basename: INR,  in the last 168 hours  Urinalysis    Component Value Date/Time   COLORURINE YELLOW 02/01/2013 0925    STUDIES: No results found.   ASSESSMENT: 62 y.o. [place], [place] woman status post right breast biopsy 01/13/2013 for a clinical T2 N0, stage IIA invasive ductal carcinoma, grade 1, estrogen receptor 100% positive, progesterone receptor 86% positive, with an MIB-1 of 14% and no HER-2 amplification  (1) status post right lumpectomy and sentinel lymph node dissection 02/01/2013 for a pT2 pN0, stage IIA invasive ductal carcinoma, grade 1, with repeat HER-2 negative and ample margins.  (2) Oncotype DX score of 13 predicts a risk of distant recurrence of 8% within 10 years if the patient's only systemic therapy is tamoxifen for 5 years. It also predicts no significant benefit from chemotherapy  (3) adjuvant radiation completed 04/27/2013  (4) starting anastrozole 06/05/2013  PLAN: We spent the better part of today's 40 minute visit discussing the difference between tamoxifen and the aromatase inhibitors. In her case I think going with anastrozole is a better bet. She is not skinny, which means she is likely to have good bone density. If she can tolerate the anastrozole and take it for 5 years she doesn't have to make any medication changes.  She has a good understanding of the possible toxicities, side effects and complications of this medication. We also discussed issues regarding cost. We are sending in the prescription to her mail order supply her. If she does not receive the pills within the next week she will let us know.  Otherwise she will see [place] again in 3 months. She will have a DEXA scan at [facility] prior to that visit. She will call us with any concerns regarding side effects or with any other problems that may develop before she returns here.Marland Kitchen Lowella Dell, MD   05/25/2013 12:02 PM

## 2013-05-25 NOTE — Telephone Encounter (Signed)
sw pt gv appt for [facility] on 06/09/13 @ 1:30pm....td

## 2013-05-25 NOTE — Telephone Encounter (Signed)
appts made and printed. Pt is aware that i will call her w/ [facility] appt due to no answer...td

## 2013-06-08 ENCOUNTER — Other Ambulatory Visit: Payer: Self-pay | Admitting: Family Medicine

## 2013-06-08 ENCOUNTER — Other Ambulatory Visit (HOSPITAL_COMMUNITY)
Admission: RE | Admit: 2013-06-08 | Discharge: 2013-06-08 | Disposition: A | Discharge status: Home / Self Care | Payer: BC Managed Care – PPO | Source: Ambulatory Visit | Attending: Family Medicine | Admitting: Family Medicine

## 2013-06-08 DIAGNOSIS — Z124 Encounter for screening for malignant neoplasm of cervix: Secondary | ICD-10-CM | POA: Insufficient documentation

## 2013-07-23 ENCOUNTER — Encounter: Payer: Self-pay | Admitting: Radiation Oncology

## 2013-07-23 ENCOUNTER — Telehealth: Payer: Self-pay | Admitting: *Deleted

## 2013-07-23 ENCOUNTER — Ambulatory Visit
Admission: RE | Admit: 2013-07-23 | Discharge: 2013-07-23 | Disposition: A | Discharge status: Home / Self Care | Payer: BC Managed Care – PPO | Source: Ambulatory Visit | Attending: Radiation Oncology | Admitting: Radiation Oncology

## 2013-07-23 VITALS — BP 157/85 | HR 75 | Temp 98.6°F | Resp 20

## 2013-07-23 DIAGNOSIS — C50411 Malignant neoplasm of upper-outer quadrant of right female breast: Secondary | ICD-10-CM

## 2013-07-23 NOTE — Progress Notes (Signed)
Radiation Oncology         [phone] ________________________________  Name: Lori Randall MRN: [id number]  Date: 07/23/2013  DOB: 09-30-1950  Follow-Up Visit Note  CC: Astrid Divine, MD  Maurice Small, MD  Diagnosis:   Invasive ductal carcinoma of the right breast  Interval Since Last Radiation:  3 months   Narrative:  The patient returns today for  follow-up.  The patient feels that she has some new breast symptoms and asked to be seen today. She complains of some pain laterally within the breast which is occasional and she describes this as a deep pain. This is not always present. She notices this more after such activities as sleeping on that side. The patient also noticed some irritation in the nipple region and wanted to be seen today. She describes some pain in this area as well.                              ALLERGIES:  has No Known Allergies.  Meds: Current Outpatient Prescriptions  Medication Sig Dispense Refill  . anastrozole (ARIMIDEX) 1 MG tablet Take 1 tablet (1 mg total) by mouth daily.  90 tablet  12  . atorvastatin (LIPITOR) 80 MG tablet Take 80 mg by mouth daily.      . Cholecalciferol (VITAMIN D3) 2000 UNITS TABS Take 2,000 Units by mouth daily.      Marland Kitchen ezetimibe (ZETIA) 10 MG tablet Take 10 mg by mouth daily.      . fish oil-omega-3 fatty acids 1000 MG capsule Take 1 g by mouth daily.      . hydrochlorothiazide (HYDRODIURIL) 25 MG tablet Take 25 mg by mouth daily.      . meloxicam (MOBIC) 15 MG tablet Take 15 mg by mouth daily.      . non-metallic deodorant Thornton Papas) MISC Apply 1 application topically daily as needed.      . promethazine (PHENERGAN) 25 MG tablet Take 1 tablet (25 mg total) by mouth every 4 (four) hours as needed for nausea.  15 tablet  0  . ramipril (ALTACE) 5 MG capsule Take 5 mg by mouth daily.       No current facility-administered medications for this encounter.    Physical Findings: The patient is in no acute distress.  Patient is alert and oriented.  oral temperature is 98.6 F (37 C). Her blood pressure is 157/85 and her pulse is 75. Her respiration is 20. .   The patient still has a palpable seroma present. No tenderness on exam or pain today in the area which she describes except for some minimal tenderness on deep palpation. The nipple areolar complex shows some dryness but no erythema, significant desquamation, or additional concerning findings..  Lab Findings: Lab Results  Component Value Date   WBC 4.6 02/01/2013   HGB 13.9 02/01/2013   HCT 40.6 02/01/2013   MCV 90.2 02/01/2013   PLT 209 02/01/2013     Radiographic Findings: No results found.  Impression:    The patient really did not have significant findings on exam today indicative of a major change in her status. We have given her some skin cream to use in the nipple area. I did not appreciate any significant findings on exam otherwise. The patient may still have a seroma present and she may also be gaining some increased sense of feeling in the breast area. No sign of infection. The patient is scheduled to  be seen by the surgeon next month. I do not believe that this needs to be moved up sooner.  Plan:  The patient knows to contact our office if we can be of further assistance. Otherwise she will followup on a when necessary basis.   Radene Gunning, M.D., Ph.D.

## 2013-07-23 NOTE — Telephone Encounter (Signed)
Patient calling in reporting that she has developed some dry peeling skin on her nipple, with RT being completed 04/27/2013. No drainage or heat noted. Informed patient that I was unsure of skin change duration caused by RT and transferred call to [facility], RN in RT for further information. Instructed patient that if Radiation Oncology did not have the answers or suggestions to call me back.

## 2013-07-23 NOTE — Telephone Encounter (Signed)
Patient phone transferred  From Dr.Magrinats office to [place], she had completed breast radiation in September, seen as a follow up in October with her breast healed, but now states"it's deep pain in that area, peeling,red again, would like to be seen", asked if she could be here at 2pm,"yes stated<since I live in [place] it will take me that long" will inform Dr.moody 11:57 AM

## 2013-07-23 NOTE — Progress Notes (Signed)
Pt states she noticed a few days ago a "throbbing on the outer side of her right breast". She has not taken any medication for this. She states there is not much pain there today. She is also c/o pain of her right nipple x 2 days, stating "it hurts as much as it did when I was getting treatment". Pt's right breast, nipple are not red, inflamed appearing, or swollen. Her right nipple appears to have crusting around the outer edge, pink in the center. She states she has not applied lotion to her right breast in past 6-8 weeks. Pt denies other pain or symptoms, nipple discharge, fever. She states she has not used any new soaps or detergents or skin products.

## 2013-08-03 ENCOUNTER — Encounter (INDEPENDENT_AMBULATORY_CARE_PROVIDER_SITE_OTHER): Payer: BC Managed Care – PPO | Admitting: General Surgery

## 2013-08-10 ENCOUNTER — Ambulatory Visit (INDEPENDENT_AMBULATORY_CARE_PROVIDER_SITE_OTHER): Payer: BC Managed Care – PPO | Admitting: General Surgery

## 2013-08-10 ENCOUNTER — Encounter (INDEPENDENT_AMBULATORY_CARE_PROVIDER_SITE_OTHER): Payer: Self-pay | Admitting: General Surgery

## 2013-08-10 VITALS — BP 136/84 | HR 68 | Temp 97.9°F | Resp 14 | Ht 67.0 in | Wt 223.8 lb

## 2013-08-10 DIAGNOSIS — C50411 Malignant neoplasm of upper-outer quadrant of right female breast: Secondary | ICD-10-CM

## 2013-08-10 DIAGNOSIS — C50419 Malignant neoplasm of upper-outer quadrant of unspecified female breast: Secondary | ICD-10-CM

## 2013-08-10 NOTE — Progress Notes (Signed)
Patient ID: Lori Randall, female   DOB: 08/07/1950, 63 y.o.   MRN: [id number] History: This patient returns for long-term followup regarding her right breast cancer. She underwent right partial mastectomy and sentinel lymph node biopsy on 02/01/2013. Final pathology shows invasive ductal carcinoma, 2.1 cm, receptor positive, HER-2-negative. Pathologic stage TI C. N0.  Subsequently she underwent radiation therapy and has done well with that. She is doing well the present time. She is on anastrozole and being followed by Dr. Jana Hakim. She had a general medical checkup by Dr. Kelton Pillar last month.  Past history, family history, social history, and review of systems are documented on the chart, unchanged, and noncontributory except as described above  Exam: Patient looks well. No distress Neck no adenopathy or mass Lungs clear to auscultation bilaterally Heart regular rate and rhythm. No murmur. No ectopy Breast moderately large. Right breast shows a well-healed incision upper outer quadrant and right axilla. Excellent contour cosmesis and nipple projection.Minimal volume loss. No mass in either breast. Slight radiation changes right breast scan. No axillary adenopathy  Assessment: Invasive ductal carcinoma right breast, upper outer quadrant, 2.1 cm, receptor positive, HER-2-negative, pathologic stage TI C. N0 Uneventful recovery following right partial mastectomy, sentinel node biopsy, adjuvant radiation therapy, and ongoing anastrozole therapy  Plan: Continued antiestrogen therapy Bilateral mammograms in June 2015 Return to see me in July 2015   [facility]. Dalbert Batman, M.D., [facility] General and Minimally invasive Surgery Breast and Colorectal Surgery Office:   [phone] Pager:   [phone]

## 2013-08-10 NOTE — Patient Instructions (Signed)
Examination of your breast and all of the lymph node areas today is normal. There is no evidence of cancer.  You appear to have recovered from your surgery and radiation therapy without any obvious complications.  Be sure to obtain bilateral mammograms in June of 2015.  Return to see Dr. Dalbert Batman in July 2015 for a one-year checkup.

## 2013-08-31 ENCOUNTER — Encounter: Payer: Self-pay | Admitting: Oncology

## 2013-08-31 NOTE — Progress Notes (Signed)
Patient left a message. I called and advised her of billing number for her to call

## 2013-09-03 ENCOUNTER — Other Ambulatory Visit: Payer: Self-pay | Admitting: *Deleted

## 2013-09-03 DIAGNOSIS — C50411 Malignant neoplasm of upper-outer quadrant of right female breast: Secondary | ICD-10-CM

## 2013-09-06 ENCOUNTER — Telehealth: Payer: Self-pay | Admitting: *Deleted

## 2013-09-06 ENCOUNTER — Ambulatory Visit (HOSPITAL_BASED_OUTPATIENT_CLINIC_OR_DEPARTMENT_OTHER): Payer: BC Managed Care – PPO | Admitting: Oncology

## 2013-09-06 ENCOUNTER — Other Ambulatory Visit (HOSPITAL_BASED_OUTPATIENT_CLINIC_OR_DEPARTMENT_OTHER): Payer: BC Managed Care – PPO

## 2013-09-06 VITALS — BP 151/73 | HR 92 | Temp 98.5°F | Resp 18 | Ht 67.0 in | Wt 225.2 lb

## 2013-09-06 DIAGNOSIS — N959 Unspecified menopausal and perimenopausal disorder: Secondary | ICD-10-CM

## 2013-09-06 DIAGNOSIS — C50411 Malignant neoplasm of upper-outer quadrant of right female breast: Secondary | ICD-10-CM

## 2013-09-06 DIAGNOSIS — Z170 Estrogen receptor positive status [ER+]: Secondary | ICD-10-CM

## 2013-09-06 DIAGNOSIS — C50419 Malignant neoplasm of upper-outer quadrant of unspecified female breast: Secondary | ICD-10-CM

## 2013-09-06 LAB — COMPREHENSIVE METABOLIC PANEL (CC13)
ALBUMIN: 4.2 g/dL (ref 3.5–5.0)
ALK PHOS: 121 U/L (ref 40–150)
ALT: 40 U/L (ref 0–55)
AST: 26 U/L (ref 5–34)
Anion Gap: 11 mEq/L (ref 3–11)
BUN: 9.9 mg/dL (ref 7.0–26.0)
CO2: 26 mEq/L (ref 22–29)
CREATININE: 0.7 mg/dL (ref 0.6–1.1)
Calcium: 11.3 mg/dL — ABNORMAL HIGH (ref 8.4–10.4)
Chloride: 105 mEq/L (ref 98–109)
Glucose: 135 mg/dl (ref 70–140)
POTASSIUM: 3.9 meq/L (ref 3.5–5.1)
Sodium: 143 mEq/L (ref 136–145)
Total Bilirubin: 0.54 mg/dL (ref 0.20–1.20)
Total Protein: 7.2 g/dL (ref 6.4–8.3)

## 2013-09-06 LAB — CBC WITH DIFFERENTIAL/PLATELET
BASO%: 1 % (ref 0.0–2.0)
BASOS ABS: 0.1 10*3/uL (ref 0.0–0.1)
EOS%: 1.2 % (ref 0.0–7.0)
Eosinophils Absolute: 0.1 10*3/uL (ref 0.0–0.5)
HCT: 40.7 % (ref 34.8–46.6)
HEMOGLOBIN: 13.8 g/dL (ref 11.6–15.9)
LYMPH%: 20.1 % (ref 14.0–49.7)
MCH: 31.5 pg (ref 25.1–34.0)
MCHC: 34 g/dL (ref 31.5–36.0)
MCV: 92.7 fL (ref 79.5–101.0)
MONO#: 0.3 10*3/uL (ref 0.1–0.9)
MONO%: 5.4 % (ref 0.0–14.0)
NEUT#: 3.8 10*3/uL (ref 1.5–6.5)
NEUT%: 72.3 % (ref 38.4–76.8)
Platelets: 213 10*3/uL (ref 145–400)
RBC: 4.38 10*6/uL (ref 3.70–5.45)
RDW: 12.6 % (ref 11.2–14.5)
WBC: 5.2 10*3/uL (ref 3.9–10.3)
lymph#: 1.1 10*3/uL (ref 0.9–3.3)

## 2013-09-06 NOTE — Telephone Encounter (Signed)
appts made and printed...td 

## 2013-09-06 NOTE — Addendum Note (Signed)
Addended by: Laureen Abrahams on: 09/06/2013 06:05 PM   Modules accepted: Orders

## 2013-09-06 NOTE — Progress Notes (Signed)
ID: Lori Randall OB: 16-May-1951  MR#: [id number]  CSN#:[id number]  PCP: Osborne Casco, MD GYN:   SU: Fanny Skates OTHER MD: Kelton Pillar, Christene Slates, Kyra Searles   HISTORY OF PRESENT ILLNESS: "Lori Randall" had routine screening mammography at [facility] 01/11/2013 showing a potential abnormality in the left breast. Additional views 01/13/2013 found that the calcifications noted in the left breast were likely benign. However on the right a previously noted mass was now irregular in contour. There were also some associated calcifications. A right breast ultrasound found a hypoechoic mass collar than wide measuring 1.2 cm. Biopsy of this mass the same day ([id number]) showed an invasive ductal carcinoma, grade 1, estrogen receptor 100% positive, progesterone receptor 86% positive, with an MIB-1 of 14% and no HER-2 amplification.  Bilateral breast MRIs 01/25/2013 showed a 2.1 cm lobulated enhancing mass in the posterior third of the upper outer quadrant of the right breast there were no other areas of concern in either breast and no enlarged axillary or internal mammary adenopathy.  The patient's subsequent history is as detailed below   INTERVAL HISTORY: Lori Randall returns today for followup of her breast cancer. Since her last visit here she started anastrozole. She is tolerating it well, with very minimal and rare hot flashes as the only possible side effect she has noted.  REVIEW OF SYSTEMS: She lost a filling this morning, went to her dentist, and ended up with a crown. She had lunch as chewing normally. She has some knee pain, which is not a new problem. She is active, but does not exercise regularly. A detailed review of systems today was otherwise noncontributory  PAST MEDICAL HISTORY: Past Medical History  Diagnosis Date  . Hypertension   . Arthritis   . Sleep apnea   . Hx of radiation therapy 03/11/13-04/27/13  . Breast cancer 01/13/13    right breast bx=invasive ca      PAST SURGICAL HISTORY: Past Surgical History  Procedure Laterality Date  . Appendectomy    . Excisional biopsy right breast    . Partial mastectomy with needle localization and axillary sentinel lymph node bx Right 02/01/2013    Procedure: PARTIAL MASTECTOMY WITH NEEDLE LOCALIZATION AND AXILLARY SENTINEL LYMPH NODE BX;  Surgeon: Adin Hector, MD;  Location: [place];  Service: General;  Laterality: Right;  needle localization at 7:30 SOLIS nuclear medicine 30 minutes prior to surgery right breast 9:30  . Breast surgery      FAMILY HISTORY No family history on file. The patient's father died at the age of 41 with congestive 4 Lovena Le in the setting of severe dementia. The patient's mother died at age 4 with atypical parkinsonism. The patient has 3 brothers and 2 sisters. There is no history of breast or ovarian cancer in the family  GYNECOLOGIC HISTORY:  Menarche age 15, first live birth age 58. The patient is GX P2. She went through menopause approximately 2004. She took hormone replacement approximately 2 years. She took birth control remotely for approximately 10 years, without complications.  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 Her husband Gearldine Shown") M. Surveyor, minerals used to work for [employer]. He is now retired. Daughter Antonietta Lansdowne is a paramedic in [facility]. Son Ardine Iacovelli is a landscaper in [place]. The patient has no grandchildren. She attends a [employer]    ADVANCED DIRECTIVES: In place.   HEALTH MAINTENANCE: History  Substance Use Topics  . Smoking status: Never Smoker   .  Smokeless tobacco: Not on file  . Alcohol Use: No     Comment: occasional     Colonoscopy: 2005  PAP: November 2014  Bone density: 06/09/2013 at [facility]; normal  Lipid panel:  No Known Allergies  Current Outpatient Prescriptions  Medication Sig Dispense Refill  . anastrozole (ARIMIDEX) 1 MG tablet Take 1 tablet (1 mg total) by mouth daily.   90 tablet  12  . atorvastatin (LIPITOR) 80 MG tablet Take 80 mg by mouth daily.      . Cholecalciferol (VITAMIN D3) 2000 UNITS TABS Take 2,000 Units by mouth daily.      Marland Kitchen ezetimibe (ZETIA) 10 MG tablet Take 10 mg by mouth daily.      . fish oil-omega-3 fatty acids 1000 MG capsule Take 1 g by mouth daily.      . hydrochlorothiazide (HYDRODIURIL) 25 MG tablet Take 25 mg by mouth daily.      . meloxicam (MOBIC) 15 MG tablet Take 15 mg by mouth daily.      . non-metallic deodorant Jethro Poling) MISC Apply 1 application topically daily as needed.      . promethazine (PHENERGAN) 25 MG tablet Take 1 tablet (25 mg total) by mouth every 4 (four) hours as needed for nausea.  15 tablet  0  . ramipril (ALTACE) 5 MG capsule Take 5 mg by mouth daily.       No current facility-administered medications for this visit.    OBJECTIVE: Middle-aged white woman who appears well Filed Vitals:   09/06/13 1348  BP: 151/73  Pulse: 92  Temp: 98.5 F (36.9 C)  Resp: 18     Body mass index is 35.26 kg/(m^2).    ECOG FS: 1  Sclerae unicteric, pupils equal round and equal Oropharynx clear, teeth in good repair No cervical or supraclavicular adenopathy Lungs no rales or rhonchi Heart regular rate and rhythm Abd obese, soft, nontender, positive bowel sounds MSK no focal spinal tenderness, no upper extremity lymphedema Neuro: non-focal, well-oriented, positive affect Breasts: The right breast is status post lumpectomy and radiation the area in the upper inner quadrant of the right breast for she feels sore sometimes is benign by exam. There is no evidence of recurrent disease. The right axilla is benign. The left breast is unremarkable.   LAB RESULTS:  CMP     Component Value Date/Time   NA 140 02/01/2013 0931   NA 142 01/27/2013 1208   K 3.9 02/01/2013 0931   K 3.8 01/27/2013 1208   CL 103 02/01/2013 0931   CL 105 01/27/2013 1208   CO2 26 02/01/2013 0931   CO2 27 01/27/2013 1208   GLUCOSE 109* 02/01/2013 0931    GLUCOSE 123* 01/27/2013 1208   BUN 13 02/01/2013 0931   BUN 14.2 01/27/2013 1208   CREATININE 0.61 02/01/2013 0931   CREATININE 0.8 01/27/2013 1208   CALCIUM 10.5 02/01/2013 0931   CALCIUM 11.3* 01/27/2013 1208   PROT 7.4 02/01/2013 0931   PROT 7.5 01/27/2013 1208   ALBUMIN 4.3 02/01/2013 0931   ALBUMIN 4.1 01/27/2013 1208   AST 24 02/01/2013 0931   AST 26 01/27/2013 1208   ALT 38* 02/01/2013 0931   ALT 39 01/27/2013 1208   ALKPHOS 114 02/01/2013 0931   ALKPHOS 115 01/27/2013 1208   BILITOT 0.4 02/01/2013 0931   BILITOT 0.47 01/27/2013 1208   GFRNONAA >90 02/01/2013 0931   GFRAA >90 02/01/2013 0931    I No results found for this basename: SPEP,  UPEP,  kappa and lambda light chains    Lab Results  Component Value Date   WBC 5.2 09/06/2013   NEUTROABS 3.8 09/06/2013   HGB 13.8 09/06/2013   HCT 40.7 09/06/2013   MCV 92.7 09/06/2013   PLT 213 09/06/2013      Chemistry      Component Value Date/Time   NA 140 02/01/2013 0931   NA 142 01/27/2013 1208   K 3.9 02/01/2013 0931   K 3.8 01/27/2013 1208   CL 103 02/01/2013 0931   CL 105 01/27/2013 1208   CO2 26 02/01/2013 0931   CO2 27 01/27/2013 1208   BUN 13 02/01/2013 0931   BUN 14.2 01/27/2013 1208   CREATININE 0.61 02/01/2013 0931   CREATININE 0.8 01/27/2013 1208      Component Value Date/Time   CALCIUM 10.5 02/01/2013 0931   CALCIUM 11.3* 01/27/2013 1208   ALKPHOS 114 02/01/2013 0931   ALKPHOS 115 01/27/2013 1208   AST 24 02/01/2013 0931   AST 26 01/27/2013 1208   ALT 38* 02/01/2013 0931   ALT 39 01/27/2013 1208   BILITOT 0.4 02/01/2013 0931   BILITOT 0.47 01/27/2013 1208       No results found for this basename: LABCA2    No components found with this basename: LABCA125    No results found for this basename: INR,  in the last 168 hours  Urinalysis    Component Value Date/Time   COLORURINE YELLOW 02/01/2013 0925    STUDIES: Next mammogram will be due June 2015. Bone density November 2014 was normal   ASSESSMENT: 63 y.o. [place], [place] woman  status post right breast biopsy 01/13/2013 for a clinical T2 N0, stage IIA invasive ductal carcinoma, grade 1, estrogen receptor 100% positive, progesterone receptor 86% positive, with an MIB-1 of 14% and no HER-2 amplification  (1) status post right lumpectomy and sentinel lymph node dissection 02/01/2013 for a pT2 pN0, stage IIA invasive ductal carcinoma, grade 1, with repeat HER-2 negative and ample margins.  (2) Oncotype DX score of 13 predicts a risk of distant recurrence of 8% within 10 years if the patient's only systemic therapy is tamoxifen for 5 years. It also predicts no significant benefit from chemotherapy  (3) adjuvant radiation completed 04/27/2013  (4) started anastrozole 06/05/2013  PLAN: She is tolerating the anastrozole with no side effects that she is aware of (other than minimal hot flashes). She has a normal bone density. Accordingly the plan is going to be to continue anastrozole for 5 years. Of course we will repeat a bone density 2 years from now. She will see [place] again in 6 months and then again in one year. At that point we will start seeing her on a yearly basis.  Juliann Pulse has a good understanding of this plan. She agrees with it. She knows to call for any problems that may develop before next visit here. Chauncey Cruel, MD   09/06/2013 2:00 PM

## 2014-03-11 ENCOUNTER — Ambulatory Visit (INDEPENDENT_AMBULATORY_CARE_PROVIDER_SITE_OTHER): Payer: BC Managed Care – PPO | Admitting: General Surgery

## 2014-03-11 ENCOUNTER — Encounter (INDEPENDENT_AMBULATORY_CARE_PROVIDER_SITE_OTHER): Payer: Self-pay | Admitting: General Surgery

## 2014-03-11 VITALS — BP 126/78 | HR 73 | Temp 97.6°F | Ht 67.0 in | Wt 226.0 lb

## 2014-03-11 DIAGNOSIS — C50419 Malignant neoplasm of upper-outer quadrant of unspecified female breast: Secondary | ICD-10-CM

## 2014-03-11 DIAGNOSIS — C50411 Malignant neoplasm of upper-outer quadrant of right female breast: Secondary | ICD-10-CM

## 2014-03-11 NOTE — Patient Instructions (Signed)
Examination of both breasts and all the regional lymph nodes today is normal. Your mammograms are also normal. There is no evidence of cancer.  Continue regular followup with Dr. Jana Hakim.  Continue to take the anastrozole.  We talked about whether to followup with both Dr. Jana Hakim and me,     or just Dr. Jana Hakim.      It was your preference to followup with him and to graduate from my care at this time.  Return to Dr. Dalbert Batman if any new problems arise.

## 2014-03-11 NOTE — Progress Notes (Signed)
Patient ID: Lori Randall, female   DOB: [birth year], 63 y.o.   MRN: [id number]  History:  This patient returns for long-term followup regarding her right breast cancer. She underwent right partial mastectomy and sentinel lymph node biopsy on 02/01/2013. Final pathology shows invasive ductal carcinoma, 2.1 cm, receptor positive, HER-2-negative. Pathologic stage TI C. N0. Subsequently she underwent radiation therapy and has done well with that. She is doing well the present time. She is on anastrozole and being followed by Dr. Jana Hakim. She had a general medical checkup by Dr. Kelton Pillar last month.  Mammograms at [facility] on June 15 to 15 look good. Category 2. No focal abnormality. She has no complaints about her breast.  Past history, family history, social history, and review of systems are documented on the chart, unchanged, and noncontributory except as described above   Exam:  Patient looks well. No distress  Neck no adenopathy or mass  Lungs clear to auscultation bilaterally  Heart regular rate and rhythm. No murmur. No ectopy  Breasts moderately large. Right breast shows a well-healed incision upper outer quadrant and right axilla. Excellent contour cosmesis and nipple projection.Minimal volume loss. No mass in either breast. Slight radiation changes right breast. No axillary adenopathy   Assessment:  Invasive ductal carcinoma right breast, upper outer quadrant, 2.1 cm, receptor positive, HER-2-negative, pathologic stage TI C. N0  Uneventful recovery following right partial mastectomy, sentinel node biopsy, adjuvant radiation therapy, and ongoing anastrozole therapy  No evidence of recurrence one year following definitive surgical and adjuvant treatment  Plan:  Continue antiestrogen therapy  Bilateral mammograms in June 2016 and anually We talked about her preferences for long-term surveillance. At this time we agreed that she would graduate from my care and followup with Dr.  Jana Hakim only. Of course, I will standby should any problems arise.     Edsel Petrin. Dalbert Batman, M.D., [facility]  General and Minimally invasive Surgery  Breast and Colorectal Surgery  Office: [phone]  Pager: [phone]

## 2014-03-15 ENCOUNTER — Other Ambulatory Visit (HOSPITAL_BASED_OUTPATIENT_CLINIC_OR_DEPARTMENT_OTHER): Payer: BC Managed Care – PPO

## 2014-03-15 ENCOUNTER — Telehealth: Payer: Self-pay | Admitting: Nurse Practitioner

## 2014-03-15 ENCOUNTER — Ambulatory Visit (HOSPITAL_BASED_OUTPATIENT_CLINIC_OR_DEPARTMENT_OTHER): Payer: BC Managed Care – PPO | Admitting: Nurse Practitioner

## 2014-03-15 VITALS — BP 137/67 | HR 78 | Temp 98.4°F | Resp 18 | Ht 67.0 in | Wt 225.3 lb

## 2014-03-15 DIAGNOSIS — C50411 Malignant neoplasm of upper-outer quadrant of right female breast: Secondary | ICD-10-CM

## 2014-03-15 DIAGNOSIS — C50419 Malignant neoplasm of upper-outer quadrant of unspecified female breast: Secondary | ICD-10-CM

## 2014-03-15 DIAGNOSIS — Z170 Estrogen receptor positive status [ER+]: Secondary | ICD-10-CM

## 2014-03-15 LAB — COMPREHENSIVE METABOLIC PANEL
ALK PHOS: 119 U/L — AB (ref 39–117)
ALT: 33 U/L (ref 0–35)
AST: 25 U/L (ref 0–37)
Albumin: 4.5 g/dL (ref 3.5–5.2)
BILIRUBIN TOTAL: 0.6 mg/dL (ref 0.2–1.2)
BUN: 11 mg/dL (ref 6–23)
CO2: 27 mEq/L (ref 19–32)
Calcium: 10.3 mg/dL (ref 8.4–10.5)
Chloride: 100 mEq/L (ref 96–112)
Creatinine, Ser: 0.66 mg/dL (ref 0.50–1.10)
GLUCOSE: 124 mg/dL — AB (ref 70–99)
Potassium: 3.9 mEq/L (ref 3.5–5.3)
Sodium: 136 mEq/L (ref 135–145)
Total Protein: 6.8 g/dL (ref 6.0–8.3)

## 2014-03-15 LAB — CBC WITH DIFFERENTIAL/PLATELET
BASO%: 0.9 % (ref 0.0–2.0)
BASOS ABS: 0 10*3/uL (ref 0.0–0.1)
EOS ABS: 0.1 10*3/uL (ref 0.0–0.5)
EOS%: 1.9 % (ref 0.0–7.0)
HCT: 41.4 % (ref 34.8–46.6)
HEMOGLOBIN: 13.7 g/dL (ref 11.6–15.9)
LYMPH%: 22.8 % (ref 14.0–49.7)
MCH: 30.6 pg (ref 25.1–34.0)
MCHC: 33.1 g/dL (ref 31.5–36.0)
MCV: 92.6 fL (ref 79.5–101.0)
MONO#: 0.3 10*3/uL (ref 0.1–0.9)
MONO%: 6.4 % (ref 0.0–14.0)
NEUT%: 68 % (ref 38.4–76.8)
NEUTROS ABS: 3.5 10*3/uL (ref 1.5–6.5)
PLATELETS: 223 10*3/uL (ref 145–400)
RBC: 4.47 10*6/uL (ref 3.70–5.45)
RDW: 13.3 % (ref 11.2–14.5)
WBC: 5.1 10*3/uL (ref 3.9–10.3)
lymph#: 1.2 10*3/uL (ref 0.9–3.3)

## 2014-03-15 NOTE — Progress Notes (Signed)
ID: Lori Randall OB: [birth year]  MR#: [id number]  VZS#:[id number]  PCP: Osborne Casco, MD GYN:   SU: Fanny Skates OTHER MD: Kelton Pillar, Christene Slates, Kyra Searles  CHIEF COMPLAINT: right breast cancer  CURRENT THERAPY: anastrozole 76m daily  BREAST CANCER HISTORY: "[facility] showing a potential abnormality in the left breast. Additional views 01/13/2013 found that the calcifications noted in the left breast were likely benign. However on the right a previously noted mass was now irregular in contour. There were also some associated calcifications. A right breast ultrasound found a hypoechoic mass collar than wide measuring 1.2 cm. Biopsy of this mass the same day ([id number] showed an invasive ductal carcinoma, grade 1, estrogen receptor 100% positive, progesterone receptor 86% positive, with an MIB-1 of 14% and no HER-2 amplification.  Bilateral breast MRIs 01/25/2013 showed a 2.1 cm lobulated enhancing mass in the posterior third of the upper outer quadrant of the right breast there were no other areas of concern in either breast and no enlarged axillary or internal mammary adenopathy.  The patient's subsequent history is as detailed below   INTERVAL HISTORY: CTye Marylandreturns today for follow up of her right breast cancer. She has been on anastrozole for almost one year now, having started in Sept 2014. She offers little in the way of complaints. She endorses occasional vaginal dryness and hot flashes. She was recommended the use of water based lube and coconut oil.  She denied the need for any medications to combat the hot flashes. She is walking daily for exercise as long as it is not too hot outside.   REVIEW OF SYSTEMS: A detailed review of systems today was otherwise noncontributory  PAST MEDICAL HISTORY: Past Medical History  Diagnosis Date  . Hypertension   . Arthritis   . Sleep apnea   . Hx of  radiation therapy 03/11/13-04/27/13  . Breast cancer 01/13/13    right breast bx=invasive ca     PAST SURGICAL HISTORY: Past Surgical History  Procedure Laterality Date  . Appendectomy    . Excisional biopsy right breast    . Partial mastectomy with needle localization and axillary sentinel lymph node bx Right 02/01/2013    Procedure: PARTIAL MASTECTOMY WITH NEEDLE LOCALIZATION AND AXILLARY SENTINEL LYMPH NODE BX;  Surgeon: HAdin Hector MD;  [place]  Service: General;  Laterality: Right;  needle localization at 7:30 SOLIS nuclear medicine 30 minutes prior to surgery right breast 9:30  . Breast surgery      FAMILY HISTORY No family history on file. The patient's father died at the age of 974with congestive 4 TLovena Lein the setting of severe dementia. The patient's mother died at age 6783with atypical parkinsonism. The patient has 3 brothers and 2 sisters. There is no history of breast or ovarian cancer in the family  GYNECOLOGIC HISTORY:  Menarche age 89 first live birth age 89 The patient is GX P2. She went through menopause approximately 2004. She took hormone replacement approximately 2 years. She took birth control remotely for approximately 10 years, without complications.  SOCIAL HISTORY:  CTye Marylandis a retired lLicensed conveyancer Her husband DGearldine Shown) M. CSurveyor, mineralsused to work for [employer] He is now retired. Daughter MMushka Laconteis a paramedic in [facility] Son DRosangelica Pevehouseis a landscaper [place] The patient has no grandchildren. She attends a [employer]   ADVANCED DIRECTIVES: In place.  HEALTH MAINTENANCE: History  Substance Use Topics  . Smoking status: Never Smoker   . Smokeless tobacco: Not on file  . Alcohol Use: No     Comment: occasional     Colonoscopy: 2005  PAP: November 2014  Bone density: 06/09/2013 at [facility]; normal  Lipid panel:  No Known Allergies  Current Outpatient Prescriptions  Medication Sig  Dispense Refill  . anastrozole (ARIMIDEX) 1 MG tablet Take 1 tablet (1 mg total) by mouth daily.  90 tablet  12  . atorvastatin (LIPITOR) 80 MG tablet Take 80 mg by mouth daily.      . Cholecalciferol (VITAMIN D3) 2000 UNITS TABS Take 2,000 Units by mouth daily.      Marland Kitchen ezetimibe (ZETIA) 10 MG tablet Take 10 mg by mouth daily.      . fish oil-omega-3 fatty acids 1000 MG capsule Take 1 g by mouth daily.      . hydrochlorothiazide (HYDRODIURIL) 25 MG tablet Take 25 mg by mouth daily.      . meloxicam (MOBIC) 15 MG tablet Take 15 mg by mouth daily.      . ramipril (ALTACE) 5 MG capsule Take 5 mg by mouth daily.       No current facility-administered medications for this visit.    OBJECTIVE: Middle-aged white woman who appears well Filed Vitals:   03/15/14 1343  BP: 137/67  Pulse: 78  Temp: 98.4 F (36.9 C)  Resp: 18     Body mass index is 35.28 kg/(m^2).    ECOG FS: 1  Skin: warm, dry  HEENT: sclerae anicteric, conjunctivae pink, oropharynx clear. No thrush or mucositis.  Lymph Nodes: No cervical or supraclavicular lymphadenopathy  Lungs: clear to auscultation bilaterally, no rales, wheezes, or rhonci  Heart: regular rate and rhythm  Abdomen: round, soft, non tender, positive bowel sounds  Musculoskeletal: No focal spinal tenderness, no peripheral edema  Neuro: non focal, well oriented, positive affect  Breasts: right breast status post lumpectomy and radiation, no evidence of local disease recurrence, right axilla benign, left breast unremarkable.  LAB RESULTS:  CMP     Component Value Date/Time   NA 143 09/06/2013 1333   NA 140 02/01/2013 0931   K 3.9 09/06/2013 1333   K 3.9 02/01/2013 0931   CL 103 02/01/2013 0931   CL 105 01/27/2013 1208   CO2 26 09/06/2013 1333   CO2 26 02/01/2013 0931   GLUCOSE 135 09/06/2013 1333   GLUCOSE 109* 02/01/2013 0931   GLUCOSE 123* 01/27/2013 1208   BUN 9.9 09/06/2013 1333   BUN 13 02/01/2013 0931   CREATININE 0.7 09/06/2013 1333   CREATININE 0.61  02/01/2013 0931   CALCIUM 11.3* 09/06/2013 1333   CALCIUM 10.5 02/01/2013 0931   PROT 7.2 09/06/2013 1333   PROT 7.4 02/01/2013 0931   ALBUMIN 4.2 09/06/2013 1333   ALBUMIN 4.3 02/01/2013 0931   AST 26 09/06/2013 1333   AST 24 02/01/2013 0931   ALT 40 09/06/2013 1333   ALT 38* 02/01/2013 0931   ALKPHOS 121 09/06/2013 1333   ALKPHOS 114 02/01/2013 0931   BILITOT 0.54 09/06/2013 1333   BILITOT 0.4 02/01/2013 0931   GFRNONAA >90 02/01/2013 0931   GFRAA >90 02/01/2013 0931    I No results found for this basename: SPEP,  UPEP,   kappa and lambda light chains    Lab Results  Component Value Date   WBC 5.1 03/15/2014   NEUTROABS 3.5 03/15/2014   HGB 13.7 03/15/2014   HCT 41.4  03/15/2014   MCV 92.6 03/15/2014   PLT 223 03/15/2014      Chemistry      Component Value Date/Time   NA 143 09/06/2013 1333   NA 140 02/01/2013 0931   K 3.9 09/06/2013 1333   K 3.9 02/01/2013 0931   CL 103 02/01/2013 0931   CL 105 01/27/2013 1208   CO2 26 09/06/2013 1333   CO2 26 02/01/2013 0931   BUN 9.9 09/06/2013 1333   BUN 13 02/01/2013 0931   CREATININE 0.7 09/06/2013 1333   CREATININE 0.61 02/01/2013 0931      Component Value Date/Time   CALCIUM 11.3* 09/06/2013 1333   CALCIUM 10.5 02/01/2013 0931   ALKPHOS 121 09/06/2013 1333   ALKPHOS 114 02/01/2013 0931   AST 26 09/06/2013 1333   AST 24 02/01/2013 0931   ALT 40 09/06/2013 1333   ALT 38* 02/01/2013 0931   BILITOT 0.54 09/06/2013 1333   BILITOT 0.4 02/01/2013 0931       No results found for this basename: LABCA2    No components found with this basename: LABCA125    No results found for this basename: INR,  in the last 168 hours  Urinalysis    Component Value Date/Time   COLORURINE YELLOW 02/01/2013 0925    STUDIES: Most recent mammogram performed on 01/17/14 was unremarkable  Most recent bone density scan showed a t-score of -0.3 (normal)  ASSESSMENT: 63 y.o. [place], [place] woman status post right breast biopsy 01/13/2013 for a clinical T2 N0, stage IIA invasive ductal  carcinoma, grade 1, estrogen receptor 100% positive, progesterone receptor 86% positive, with an MIB-1 of 14% and no HER-2 amplification  (1) status post right lumpectomy and sentinel lymph node dissection 02/01/2013 for a pT2 pN0, stage IIA invasive ductal carcinoma, grade 1, with repeat HER-2 negative and ample margins.  (2) Oncotype DX score of 13 predicts a risk of distant recurrence of 8% within 10 years if the patient's only systemic therapy is tamoxifen for 5 years. It also predicts no significant benefit from chemotherapy  (3) adjuvant radiation completed 04/27/2013  (4) started anastrozole 06/05/2013  PLAN: "Tye Lori Randall" is doing well as far as her breast cancer is concerned. She is tolerating the anastrozole very well. Her CBC was reviewed in detail and was normal, the CMP is not available at this time.   The plan is to continue the anastrozole for 5 years. She will return to see Dr. Jana Hakim in February 2016 for labs and an office visit. Her next bone density scan will be due in November 2016. Tye Lori Randall understands and agrees with the plan. She has been encouraged to call with any issues that arise before her next visit here.  Marcelino Duster, NP   03/15/2014 3:35 PM

## 2014-03-15 NOTE — Telephone Encounter (Signed)
per pof to sch pt appt-gave pt copy of sch °

## 2014-05-19 ENCOUNTER — Encounter: Payer: Self-pay | Admitting: *Deleted

## 2014-06-06 ENCOUNTER — Encounter (INDEPENDENT_AMBULATORY_CARE_PROVIDER_SITE_OTHER): Payer: Self-pay | Admitting: General Surgery

## 2014-08-11 ENCOUNTER — Other Ambulatory Visit: Payer: Self-pay | Admitting: Gastroenterology

## 2014-08-15 ENCOUNTER — Other Ambulatory Visit: Payer: Self-pay | Admitting: Oncology

## 2014-08-15 DIAGNOSIS — C50411 Malignant neoplasm of upper-outer quadrant of right female breast: Secondary | ICD-10-CM

## 2014-08-19 ENCOUNTER — Other Ambulatory Visit: Payer: Self-pay | Admitting: Orthopedic Surgery

## 2014-08-22 ENCOUNTER — Encounter (HOSPITAL_COMMUNITY)
Admission: RE | Admit: 2014-08-22 | Discharge: 2014-08-22 | Disposition: A | Discharge status: Home / Self Care | Payer: BLUE CROSS/BLUE SHIELD | Source: Ambulatory Visit | Attending: Orthopedic Surgery | Admitting: Orthopedic Surgery

## 2014-08-22 ENCOUNTER — Ambulatory Visit (HOSPITAL_COMMUNITY)
Admission: RE | Admit: 2014-08-22 | Discharge: 2014-08-22 | Disposition: A | Discharge status: Home / Self Care | Payer: BLUE CROSS/BLUE SHIELD | Source: Ambulatory Visit | Attending: Orthopedic Surgery | Admitting: Orthopedic Surgery

## 2014-08-22 ENCOUNTER — Encounter (HOSPITAL_COMMUNITY): Payer: Self-pay

## 2014-08-22 DIAGNOSIS — Z01818 Encounter for other preprocedural examination: Secondary | ICD-10-CM

## 2014-08-22 HISTORY — DX: Adverse effect of unspecified anesthetic, initial encounter: T41.45XA

## 2014-08-22 HISTORY — DX: Reserved for inherently not codable concepts without codable children: IMO0001

## 2014-08-22 HISTORY — DX: Other specified postprocedural states: Z98.890

## 2014-08-22 HISTORY — DX: Other complications of anesthesia, initial encounter: T88.59XA

## 2014-08-22 HISTORY — DX: Nausea with vomiting, unspecified: R11.2

## 2014-08-22 LAB — CBC WITH DIFFERENTIAL/PLATELET
Basophils Absolute: 0.1 10*3/uL (ref 0.0–0.1)
Basophils Relative: 1 % (ref 0–1)
EOS PCT: 1 % (ref 0–5)
Eosinophils Absolute: 0.1 10*3/uL (ref 0.0–0.7)
HCT: 43.6 % (ref 36.0–46.0)
Hemoglobin: 15 g/dL (ref 12.0–15.0)
Lymphocytes Relative: 26 % (ref 12–46)
Lymphs Abs: 1.7 10*3/uL (ref 0.7–4.0)
MCH: 31.4 pg (ref 26.0–34.0)
MCHC: 34.4 g/dL (ref 30.0–36.0)
MCV: 91.2 fL (ref 78.0–100.0)
MONO ABS: 0.5 10*3/uL (ref 0.1–1.0)
MONOS PCT: 8 % (ref 3–12)
Neutro Abs: 4.2 10*3/uL (ref 1.7–7.7)
Neutrophils Relative %: 64 % (ref 43–77)
PLATELETS: 279 10*3/uL (ref 150–400)
RBC: 4.78 MIL/uL (ref 3.87–5.11)
RDW: 12.4 % (ref 11.5–15.5)
WBC: 6.5 10*3/uL (ref 4.0–10.5)

## 2014-08-22 LAB — URINALYSIS, ROUTINE W REFLEX MICROSCOPIC
Bilirubin Urine: NEGATIVE
Glucose, UA: NEGATIVE mg/dL
HGB URINE DIPSTICK: NEGATIVE
Ketones, ur: NEGATIVE mg/dL
Leukocytes, UA: NEGATIVE
Nitrite: NEGATIVE
PROTEIN: NEGATIVE mg/dL
Specific Gravity, Urine: 1.009 (ref 1.005–1.030)
Urobilinogen, UA: 0.2 mg/dL (ref 0.0–1.0)
pH: 7 (ref 5.0–8.0)

## 2014-08-22 LAB — TYPE AND SCREEN
ABO/RH(D): AB NEG
ANTIBODY SCREEN: NEGATIVE

## 2014-08-22 LAB — BASIC METABOLIC PANEL
ANION GAP: 9 (ref 5–15)
BUN: 9 mg/dL (ref 6–23)
CALCIUM: 10.9 mg/dL — AB (ref 8.4–10.5)
CO2: 27 mmol/L (ref 19–32)
Chloride: 104 mEq/L (ref 96–112)
Creatinine, Ser: 0.63 mg/dL (ref 0.50–1.10)
GFR calc Af Amer: >90 mL/min (ref >90)
Glucose, Bld: 117 mg/dL — ABNORMAL HIGH (ref 70–99)
Potassium: 4 mmol/L (ref 3.5–5.1)
Sodium: 140 mmol/L (ref 135–145)

## 2014-08-22 LAB — PROTIME-INR
INR: 0.99 (ref 0.00–1.49)
PROTHROMBIN TIME: 13.2 s (ref 11.6–15.2)

## 2014-08-22 LAB — APTT: APTT: 27 s (ref 24–37)

## 2014-08-22 LAB — SURGICAL PCR SCREEN
MRSA, PCR: NEGATIVE
Staphylococcus aureus: NEGATIVE

## 2014-08-22 LAB — ABO/RH: ABO/RH(D): AB NEG

## 2014-08-22 NOTE — Pre-Procedure Instructions (Addendum)
Lori Randall  08/22/2014   Your procedure is scheduled on: Wednesday, January 27.  Report to [facility] Admitting at 10:45AM.              For any other questions, please call [phone], Monday - Friday 8 AM - 4 PM.  Call this number if you have problems the morning of surgery: [phone]   Remember:   Do not eat food or drink liquids after midnight: Wednesday, January 26.   Take these medicines the morning of surgery with A SIP OF WATER: Anastrozole. Hydrocodone-Acetaminophen if needed.     Do not wear jewelry, make-up or nail polish.  Do not wear lotions, powders, or perfumes.   Do not shave 48 hours prior to surgery.   Do not bring valuables to the hospital.               [facility] is not responsible  for any belongings or valuables.               Contacts, dentures or bridgework may not be worn into surgery.  Leave suitcase in the car. After surgery it may be brought to your room.  For patients admitted to the hospital, discharge time is determined by your treatment team.               Patients discharged the day of surgery will not be allowed to drive home.  Name and phone number of your driver: -   Special Instructions: .Review  [facility] - Preparing For Surgery.   Please read over the following fact sheets that you were given: Pain Booklet, Coughing and Deep Breathing, Blood Transfusion Information and Surgical Site Infection Prevention

## 2014-08-23 NOTE — Progress Notes (Signed)
Anesthesia Chart Review:  Pt is 64 year old female scheduled for R total knee arthroplasty on 08/31/2014 with Dr. Mayer Camel.   PMH includes: HTN, OSA, breast cancer. BMI 35  Medications include: arimidex, hctz, ramipril  Preoperative labs reviewed.    Chest x-ray reviewed. No acute findings.   EKG: NSR.   If no changes, I anticipate pt can proceed with surgery as scheduled.   Willeen Cass, FNP-BC [facility] Short Stay Surgical Center/Anesthesiology Phone: [phone] 08/23/2014 4:40 PM

## 2014-08-26 NOTE — H&P (Signed)
TOTAL KNEE ADMISSION H&P  Patient is being admitted for right total knee arthroplasty.  Subjective:  Chief Complaint:right knee pain.  HPI: Lori Randall, 64 y.o. female, has a history of pain and functional disability in the right knee due to arthritis and has failed non-surgical conservative treatments for greater than 12 weeks to includeNSAID's and/or analgesics, corticosteriod injections, flexibility and strengthening excercises, weight reduction as appropriate and activity modification.  Onset of symptoms was gradual, starting 3 years ago with gradually worsening course since that time. The patient noted no past surgery on the right knee(s).  Patient currently rates pain in the right knee(s) at 10 out of 10 with activity. Patient has night pain, worsening of pain with activity and weight bearing, pain that interferes with activities of daily living and pain with passive range of motion.  Patient has evidence of subchondral sclerosis and joint space narrowing by imaging studies.  There is no active infection.  Patient Active Problem List   Diagnosis Date Noted  . Breast cancer of upper-outer quadrant of right female breast 05/25/2013   Past Medical History  Diagnosis Date  . Hypertension   . Arthritis   . Sleep apnea   . Hx of radiation therapy 03/11/13-04/27/13  . Breast cancer 01/13/13    right breast bx=invasive ca   . Complication of anesthesia   . PONV (postoperative nausea and vomiting)     2014 after breast surgery  . Shortness of breath dyspnea     with exertion     Past Surgical History  Procedure Laterality Date  . Excisional biopsy right breast    . Partial mastectomy with needle localization and axillary sentinel lymph node bx Right 02/01/2013    Procedure: PARTIAL MASTECTOMY WITH NEEDLE LOCALIZATION AND AXILLARY SENTINEL LYMPH NODE BX;  Surgeon: Adin Hector, MD;  Location: [facility];  Service: General;  Laterality: Right;  needle localization at 7:30 SOLIS nuclear  medicine 30 minutes prior to surgery right breast 9:30  . Breast surgery    . Appendectomy      age 80  . Colonoscopy w/ polypectomy      No prescriptions prior to admission   No Known Allergies  History  Substance Use Topics  . Smoking status: Never Smoker   . Smokeless tobacco: Not on file  . Alcohol Use: No     Comment: maybe 1 glass of wine a month    No family history on file.   Review of Systems  Constitutional: Negative.   HENT: Negative.   Eyes:       Glasses  Respiratory: Negative.   Cardiovascular: Negative.   Gastrointestinal: Negative.   Genitourinary: Negative.   Musculoskeletal: Positive for joint pain.  Skin: Negative.   Neurological: Negative.   Endo/Heme/Allergies: Negative.   Psychiatric/Behavioral: Negative.     Objective:  Physical Exam  Constitutional: She is oriented to person, place, and time. She appears well-developed and well-nourished.  HENT:  Head: Normocephalic and atraumatic.  Eyes: Pupils are equal, round, and reactive to light.  Neck: Normal range of motion. Neck supple.  Cardiovascular: Intact distal pulses.   Respiratory: Effort normal.  Musculoskeletal: She exhibits tenderness.  Her skin is intact.  Patient's respirations are regular and nonlabored.  As a range from approximately 10 to 105 on the right and 10 to 115 on the left.  Obvious medial joint line tenderness.  No instability.  Obvious crepitus with range of motion.  Her calves are soft and nontender.  She is  neurovascularly intact distally.  Neurological: She is alert and oriented to person, place, and time.  Skin: Skin is warm and dry.  Psychiatric: She has a normal mood and affect. Her behavior is normal. Judgment and thought content normal.    Vital signs in last 24 hours:    Labs:   Estimated body mass index is 35.28 kg/(m^2) as calculated from the following:   Height as of 03/15/14: 5\' 7"  (1.702 m).   Weight as of 03/15/14: 102.195 kg (225 lb 4.8  oz).   Imaging Review Plain radiographs demonstrate AP, Rosenberg, lateral and sunrise x-rays of her knees show end-stage arthritis on the right bone-on-bone medially with 4 mm lateral subluxation of tibia beneath the femur near bone-on-bone on the left medial compartment.  Assessment/Plan:  End stage arthritis, right knee   The patient history, physical examination, clinical judgment of the provider and imaging studies are consistent with end stage degenerative joint disease of the right knee(s) and total knee arthroplasty is deemed medically necessary. The treatment options including medical management, injection therapy arthroscopy and arthroplasty were discussed at length. The risks and benefits of total knee arthroplasty were presented and reviewed. The risks due to aseptic loosening, infection, stiffness, patella tracking problems, thromboembolic complications and other imponderables were discussed. The patient acknowledged the explanation, agreed to proceed with the plan and consent was signed. Patient is being admitted for inpatient treatment for surgery, pain control, PT, OT, prophylactic antibiotics, VTE prophylaxis, progressive ambulation and ADL's and discharge planning. The patient is planning to be discharged home with home health services

## 2014-08-30 MED ORDER — CHLORHEXIDINE GLUCONATE 4 % EX LIQD
60.0000 mL | Freq: Once | CUTANEOUS | Status: DC
  Filled 2014-08-30: qty 60

## 2014-08-30 MED ORDER — CEFAZOLIN SODIUM-DEXTROSE 2-3 GM-% IV SOLR
2.0000 g | INTRAVENOUS | Status: AC
  Administered 2014-08-31: 2 g via INTRAVENOUS
  Filled 2014-08-30: qty 50

## 2014-08-30 MED ORDER — DEXTROSE-NACL 5-0.45 % IV SOLN: INTRAVENOUS | Status: DC

## 2014-08-31 ENCOUNTER — Encounter (HOSPITAL_COMMUNITY)
Admission: RE | Disposition: A | Discharge status: Home Health | Payer: Self-pay | Source: Ambulatory Visit | Attending: Orthopedic Surgery

## 2014-08-31 ENCOUNTER — Encounter (HOSPITAL_COMMUNITY): Payer: Self-pay | Admitting: *Deleted

## 2014-08-31 ENCOUNTER — Inpatient Hospital Stay (HOSPITAL_COMMUNITY): Payer: BLUE CROSS/BLUE SHIELD | Admitting: Emergency Medicine

## 2014-08-31 ENCOUNTER — Inpatient Hospital Stay (HOSPITAL_COMMUNITY): Payer: BLUE CROSS/BLUE SHIELD | Admitting: Anesthesiology

## 2014-08-31 ENCOUNTER — Inpatient Hospital Stay (HOSPITAL_COMMUNITY)
Admission: RE | Admit: 2014-08-31 | Discharge: 2014-09-02 | DRG: 470 | Disposition: A | Discharge status: Home Health | Payer: BLUE CROSS/BLUE SHIELD | Source: Ambulatory Visit | Attending: Orthopedic Surgery | Admitting: Orthopedic Surgery

## 2014-08-31 DIAGNOSIS — I10 Essential (primary) hypertension: Secondary | ICD-10-CM | POA: Diagnosis present

## 2014-08-31 DIAGNOSIS — Z853 Personal history of malignant neoplasm of breast: Secondary | ICD-10-CM

## 2014-08-31 DIAGNOSIS — M25561 Pain in right knee: Secondary | ICD-10-CM | POA: Diagnosis present

## 2014-08-31 DIAGNOSIS — Z7982 Long term (current) use of aspirin: Secondary | ICD-10-CM | POA: Diagnosis not present

## 2014-08-31 DIAGNOSIS — D62 Acute posthemorrhagic anemia: Secondary | ICD-10-CM | POA: Diagnosis not present

## 2014-08-31 DIAGNOSIS — Z923 Personal history of irradiation: Secondary | ICD-10-CM

## 2014-08-31 DIAGNOSIS — Z79899 Other long term (current) drug therapy: Secondary | ICD-10-CM

## 2014-08-31 DIAGNOSIS — G8918 Other acute postprocedural pain: Secondary | ICD-10-CM | POA: Diagnosis not present

## 2014-08-31 DIAGNOSIS — M1711 Unilateral primary osteoarthritis, right knee: Principal | ICD-10-CM | POA: Diagnosis present

## 2014-08-31 DIAGNOSIS — G4733 Obstructive sleep apnea (adult) (pediatric): Secondary | ICD-10-CM | POA: Diagnosis present

## 2014-08-31 HISTORY — PX: TOTAL KNEE ARTHROPLASTY: SHX125

## 2014-08-31 SURGERY — ARTHROPLASTY, KNEE, TOTAL
Anesthesia: General | Site: Knee | Laterality: Right

## 2014-08-31 MED ORDER — DEXMEDETOMIDINE HCL IN NACL 200 MCG/50ML IV SOLN
INTRAVENOUS | Status: AC
  Filled 2014-08-31: qty 50

## 2014-08-31 MED ORDER — TRANEXAMIC ACID 100 MG/ML IV SOLN
1000.0000 mg | INTRAVENOUS | Status: AC
  Administered 2014-08-31: 1000 mg via INTRAVENOUS
  Filled 2014-08-31: qty 10

## 2014-08-31 MED ORDER — PROPOFOL 10 MG/ML IV BOLUS
INTRAVENOUS | Status: DC | PRN
  Administered 2014-08-31: 150 mg via INTRAVENOUS
  Administered 2014-08-31: 100 mg via INTRAVENOUS

## 2014-08-31 MED ORDER — ONDANSETRON HCL 4 MG/2ML IJ SOLN
INTRAMUSCULAR | Status: AC
  Filled 2014-08-31: qty 2

## 2014-08-31 MED ORDER — FENTANYL CITRATE 0.05 MG/ML IJ SOLN
INTRAMUSCULAR | Status: DC | PRN
  Administered 2014-08-31 (×2): 150 ug via INTRAVENOUS
  Administered 2014-08-31 (×3): 100 ug via INTRAVENOUS
  Administered 2014-08-31: 50 ug via INTRAVENOUS
  Administered 2014-08-31: 100 ug via INTRAVENOUS

## 2014-08-31 MED ORDER — LIDOCAINE HCL (CARDIAC) 20 MG/ML IV SOLN
INTRAVENOUS | Status: DC | PRN
  Administered 2014-08-31: 50 mg via INTRAVENOUS
  Administered 2014-08-31: 100 mg via INTRAVENOUS

## 2014-08-31 MED ORDER — RAMIPRIL 5 MG PO CAPS
5.0000 mg | ORAL_CAPSULE | Freq: Every day | ORAL | Status: DC
  Administered 2014-08-31 – 2014-09-02 (×3): 5 mg via ORAL
  Filled 2014-08-31 (×3): qty 1

## 2014-08-31 MED ORDER — MEPERIDINE HCL 25 MG/ML IJ SOLN: 6.2500 mg | INTRAMUSCULAR | Status: DC | PRN

## 2014-08-31 MED ORDER — PHENYLEPHRINE 40 MCG/ML (10ML) SYRINGE FOR IV PUSH (FOR BLOOD PRESSURE SUPPORT)
PREFILLED_SYRINGE | INTRAVENOUS | Status: AC
  Filled 2014-08-31: qty 10

## 2014-08-31 MED ORDER — GLYCOPYRROLATE 0.2 MG/ML IJ SOLN
INTRAMUSCULAR | Status: AC
  Filled 2014-08-31: qty 2

## 2014-08-31 MED ORDER — NEOSTIGMINE METHYLSULFATE 10 MG/10ML IV SOLN
INTRAVENOUS | Status: AC
  Filled 2014-08-31: qty 1

## 2014-08-31 MED ORDER — ONDANSETRON HCL 4 MG/2ML IJ SOLN
INTRAMUSCULAR | Status: AC
  Filled 2014-08-31: qty 6

## 2014-08-31 MED ORDER — DEXAMETHASONE SODIUM PHOSPHATE 4 MG/ML IJ SOLN
INTRAMUSCULAR | Status: DC | PRN
  Administered 2014-08-31: 8 mg via INTRAVENOUS

## 2014-08-31 MED ORDER — ONDANSETRON HCL 4 MG PO TABS: 4.0000 mg | ORAL_TABLET | Freq: Four times a day (QID) | ORAL | Status: DC | PRN

## 2014-08-31 MED ORDER — DOCUSATE SODIUM 100 MG PO CAPS
100.0000 mg | ORAL_CAPSULE | Freq: Two times a day (BID) | ORAL | Status: DC
  Administered 2014-08-31 – 2014-09-02 (×4): 100 mg via ORAL
  Filled 2014-08-31 (×7): qty 1

## 2014-08-31 MED ORDER — SCOPOLAMINE 1 MG/3DAYS TD PT72
MEDICATED_PATCH | TRANSDERMAL | Status: AC
  Administered 2014-08-31: 1.5 mg via TRANSDERMAL
  Filled 2014-08-31: qty 1

## 2014-08-31 MED ORDER — EPINEPHRINE HCL 1 MG/ML IJ SOLN
INTRAMUSCULAR | Status: AC
  Filled 2014-08-31: qty 1

## 2014-08-31 MED ORDER — DEXAMETHASONE SODIUM PHOSPHATE 4 MG/ML IJ SOLN
INTRAMUSCULAR | Status: AC
  Filled 2014-08-31: qty 2

## 2014-08-31 MED ORDER — ALUM & MAG HYDROXIDE-SIMETH 200-200-20 MG/5ML PO SUSP: 30.0000 mL | ORAL | Status: DC | PRN

## 2014-08-31 MED ORDER — ANASTROZOLE 1 MG PO TABS
1.0000 mg | ORAL_TABLET | Freq: Every day | ORAL | Status: DC
  Administered 2014-09-01 – 2014-09-02 (×2): 1 mg via ORAL
  Filled 2014-08-31 (×3): qty 1

## 2014-08-31 MED ORDER — METHOCARBAMOL 500 MG PO TABS: 500.0000 mg | ORAL_TABLET | Freq: Two times a day (BID) | ORAL | Status: DC

## 2014-08-31 MED ORDER — ACETAMINOPHEN 325 MG PO TABS
650.0000 mg | ORAL_TABLET | Freq: Four times a day (QID) | ORAL | Status: DC | PRN
  Administered 2014-08-31 (×2): 650 mg via ORAL
  Filled 2014-08-31: qty 2

## 2014-08-31 MED ORDER — METHOCARBAMOL 500 MG PO TABS
ORAL_TABLET | ORAL | Status: AC
  Administered 2014-08-31: 500 mg via ORAL
  Filled 2014-08-31: qty 1

## 2014-08-31 MED ORDER — KCL IN DEXTROSE-NACL 20-5-0.45 MEQ/L-%-% IV SOLN
INTRAVENOUS | Status: DC
  Administered 2014-08-31: 125 mL/h via INTRAVENOUS
  Filled 2014-08-31 (×7): qty 1000

## 2014-08-31 MED ORDER — MIDAZOLAM HCL 2 MG/2ML IJ SOLN
1.0000 mg | INTRAMUSCULAR | Status: DC | PRN
  Administered 2014-08-31: 1 mg via INTRAVENOUS

## 2014-08-31 MED ORDER — ARTIFICIAL TEARS OP OINT
TOPICAL_OINTMENT | OPHTHALMIC | Status: DC | PRN
  Administered 2014-08-31: 1 via OPHTHALMIC

## 2014-08-31 MED ORDER — FENTANYL CITRATE 0.05 MG/ML IJ SOLN
INTRAMUSCULAR | Status: AC
  Administered 2014-08-31: 50 ug via INTRAVENOUS
  Filled 2014-08-31: qty 2

## 2014-08-31 MED ORDER — ARTIFICIAL TEARS OP OINT
TOPICAL_OINTMENT | OPHTHALMIC | Status: AC
  Filled 2014-08-31: qty 3.5

## 2014-08-31 MED ORDER — EPHEDRINE SULFATE 50 MG/ML IJ SOLN
INTRAMUSCULAR | Status: AC
  Filled 2014-08-31: qty 1

## 2014-08-31 MED ORDER — SODIUM CHLORIDE 0.9 % IJ SOLN
INTRAMUSCULAR | Status: DC | PRN
  Administered 2014-08-31: 40 mL via INTRAVENOUS

## 2014-08-31 MED ORDER — PHENYLEPHRINE 40 MCG/ML (10ML) SYRINGE FOR IV PUSH (FOR BLOOD PRESSURE SUPPORT)
PREFILLED_SYRINGE | INTRAVENOUS | Status: AC
  Filled 2014-08-31: qty 20

## 2014-08-31 MED ORDER — HYDROCHLOROTHIAZIDE 25 MG PO TABS
25.0000 mg | ORAL_TABLET | Freq: Every day | ORAL | Status: DC
  Administered 2014-08-31 – 2014-09-02 (×3): 25 mg via ORAL
  Filled 2014-08-31 (×3): qty 1

## 2014-08-31 MED ORDER — VECURONIUM BROMIDE 10 MG IV SOLR
INTRAVENOUS | Status: DC | PRN
  Administered 2014-08-31: 2 mg via INTRAVENOUS
  Administered 2014-08-31: 4 mg via INTRAVENOUS

## 2014-08-31 MED ORDER — LACTATED RINGERS IV SOLN
INTRAVENOUS | Status: DC | PRN
  Administered 2014-08-31 (×2): via INTRAVENOUS

## 2014-08-31 MED ORDER — ROCURONIUM BROMIDE 50 MG/5ML IV SOLN
INTRAVENOUS | Status: AC
  Filled 2014-08-31: qty 1

## 2014-08-31 MED ORDER — BUPIVACAINE LIPOSOME 1.3 % IJ SUSP
20.0000 mL | Freq: Once | INTRAMUSCULAR | Status: AC
  Administered 2014-08-31: 20 mL
  Filled 2014-08-31: qty 20

## 2014-08-31 MED ORDER — SODIUM CHLORIDE 0.9 % IJ SOLN
INTRAMUSCULAR | Status: AC
  Filled 2014-08-31: qty 10

## 2014-08-31 MED ORDER — OXYCODONE HCL 5 MG PO TABS
5.0000 mg | ORAL_TABLET | ORAL | Status: DC | PRN
  Administered 2014-08-31 – 2014-09-02 (×8): 10 mg via ORAL
  Filled 2014-08-31 (×7): qty 2

## 2014-08-31 MED ORDER — CEFUROXIME SODIUM 1.5 G IJ SOLR
INTRAMUSCULAR | Status: DC | PRN
  Administered 2014-08-31: 1.5 g

## 2014-08-31 MED ORDER — HYDROMORPHONE HCL 1 MG/ML IJ SOLN
1.0000 mg | INTRAMUSCULAR | Status: DC | PRN
  Administered 2014-08-31 – 2014-09-01 (×5): 1 mg via INTRAVENOUS
  Filled 2014-08-31 (×5): qty 1

## 2014-08-31 MED ORDER — ONDANSETRON HCL 4 MG/2ML IJ SOLN
INTRAMUSCULAR | Status: DC | PRN
  Administered 2014-08-31 (×2): 4 mg via INTRAVENOUS

## 2014-08-31 MED ORDER — SENNOSIDES-DOCUSATE SODIUM 8.6-50 MG PO TABS: 1.0000 | ORAL_TABLET | Freq: Every evening | ORAL | Status: DC | PRN

## 2014-08-31 MED ORDER — OXYCODONE-ACETAMINOPHEN 5-325 MG PO TABS: 1.0000 | ORAL_TABLET | ORAL | Status: DC | PRN

## 2014-08-31 MED ORDER — PROMETHAZINE HCL 25 MG/ML IJ SOLN: 6.2500 mg | INTRAMUSCULAR | Status: DC | PRN

## 2014-08-31 MED ORDER — ATORVASTATIN CALCIUM 80 MG PO TABS
80.0000 mg | ORAL_TABLET | Freq: Every day | ORAL | Status: DC
  Administered 2014-08-31 – 2014-09-02 (×3): 80 mg via ORAL
  Filled 2014-08-31 (×3): qty 1

## 2014-08-31 MED ORDER — MENTHOL 3 MG MT LOZG: 1.0000 | LOZENGE | OROMUCOSAL | Status: DC | PRN

## 2014-08-31 MED ORDER — LACTATED RINGERS IV SOLN
INTRAVENOUS | Status: DC
  Administered 2014-08-31: 11:00:00 via INTRAVENOUS

## 2014-08-31 MED ORDER — FENTANYL CITRATE 0.05 MG/ML IJ SOLN
25.0000 ug | INTRAMUSCULAR | Status: DC | PRN
  Administered 2014-08-31 (×2): 50 ug via INTRAVENOUS

## 2014-08-31 MED ORDER — METOCLOPRAMIDE HCL 10 MG PO TABS: 5.0000 mg | ORAL_TABLET | Freq: Three times a day (TID) | ORAL | Status: DC | PRN

## 2014-08-31 MED ORDER — FENTANYL CITRATE 0.05 MG/ML IJ SOLN
INTRAMUSCULAR | Status: AC
  Filled 2014-08-31: qty 5

## 2014-08-31 MED ORDER — PHENOL 1.4 % MT LIQD: 1.0000 | OROMUCOSAL | Status: DC | PRN

## 2014-08-31 MED ORDER — SODIUM CHLORIDE 0.9 % IR SOLN
Status: DC | PRN
  Administered 2014-08-31: 1000 mL

## 2014-08-31 MED ORDER — METHOCARBAMOL 500 MG PO TABS
500.0000 mg | ORAL_TABLET | Freq: Four times a day (QID) | ORAL | Status: DC | PRN
  Administered 2014-08-31 – 2014-09-02 (×4): 500 mg via ORAL
  Filled 2014-08-31 (×3): qty 1

## 2014-08-31 MED ORDER — GLYCOPYRROLATE 0.2 MG/ML IJ SOLN
INTRAMUSCULAR | Status: DC | PRN
  Administered 2014-08-31: 0.6 mg via INTRAVENOUS

## 2014-08-31 MED ORDER — MIDAZOLAM HCL 2 MG/2ML IJ SOLN
INTRAMUSCULAR | Status: AC
  Filled 2014-08-31: qty 2

## 2014-08-31 MED ORDER — BUPIVACAINE-EPINEPHRINE (PF) 0.5% -1:200000 IJ SOLN
INTRAMUSCULAR | Status: DC | PRN
  Administered 2014-08-31: 30 mL via PERINEURAL

## 2014-08-31 MED ORDER — GLYCOPYRROLATE 0.2 MG/ML IJ SOLN
INTRAMUSCULAR | Status: AC
  Filled 2014-08-31: qty 3

## 2014-08-31 MED ORDER — ASPIRIN EC 325 MG PO TBEC
325.0000 mg | DELAYED_RELEASE_TABLET | Freq: Every day | ORAL | Status: DC
  Administered 2014-09-01 – 2014-09-02 (×2): 325 mg via ORAL
  Filled 2014-08-31 (×3): qty 1

## 2014-08-31 MED ORDER — HYDROMORPHONE HCL 1 MG/ML IJ SOLN
INTRAMUSCULAR | Status: AC
  Filled 2014-08-31: qty 1

## 2014-08-31 MED ORDER — FENTANYL CITRATE 0.05 MG/ML IJ SOLN
50.0000 ug | INTRAMUSCULAR | Status: DC | PRN
  Administered 2014-08-31: 50 ug via INTRAVENOUS

## 2014-08-31 MED ORDER — NEOSTIGMINE METHYLSULFATE 10 MG/10ML IV SOLN
INTRAVENOUS | Status: DC | PRN
  Administered 2014-08-31: 5 mg via INTRAVENOUS

## 2014-08-31 MED ORDER — ACETAMINOPHEN 650 MG RE SUPP: 650.0000 mg | Freq: Four times a day (QID) | RECTAL | Status: DC | PRN

## 2014-08-31 MED ORDER — SUCCINYLCHOLINE CHLORIDE 20 MG/ML IJ SOLN
INTRAMUSCULAR | Status: AC
  Filled 2014-08-31: qty 1

## 2014-08-31 MED ORDER — ACETAMINOPHEN 325 MG PO TABS
ORAL_TABLET | ORAL | Status: AC
  Administered 2014-08-31: 650 mg via ORAL
  Filled 2014-08-31: qty 2

## 2014-08-31 MED ORDER — ASPIRIN EC 325 MG PO TBEC: 325.0000 mg | DELAYED_RELEASE_TABLET | Freq: Two times a day (BID) | ORAL | Status: DC

## 2014-08-31 MED ORDER — MAGNESIUM CITRATE PO SOLN: 1.0000 | Freq: Once | ORAL | Status: AC | PRN

## 2014-08-31 MED ORDER — METHOCARBAMOL 1000 MG/10ML IJ SOLN
500.0000 mg | Freq: Four times a day (QID) | INTRAVENOUS | Status: DC | PRN
  Filled 2014-08-31: qty 5

## 2014-08-31 MED ORDER — SCOPOLAMINE 1 MG/3DAYS TD PT72
1.0000 | MEDICATED_PATCH | TRANSDERMAL | Status: DC
  Administered 2014-08-31: 1.5 mg via TRANSDERMAL

## 2014-08-31 MED ORDER — BISACODYL 5 MG PO TBEC: 5.0000 mg | DELAYED_RELEASE_TABLET | Freq: Every day | ORAL | Status: DC | PRN

## 2014-08-31 MED ORDER — PNEUMOCOCCAL VAC POLYVALENT 25 MCG/0.5ML IJ INJ
0.5000 mL | INJECTION | INTRAMUSCULAR | Status: AC
  Administered 2014-09-01: 0.5 mL via INTRAMUSCULAR
  Filled 2014-08-31: qty 0.5

## 2014-08-31 MED ORDER — DIPHENHYDRAMINE HCL 12.5 MG/5ML PO ELIX: 12.5000 mg | ORAL_SOLUTION | ORAL | Status: DC | PRN

## 2014-08-31 MED ORDER — STERILE WATER FOR INJECTION IJ SOLN
INTRAMUSCULAR | Status: AC
  Filled 2014-08-31: qty 20

## 2014-08-31 MED ORDER — ROCURONIUM BROMIDE 100 MG/10ML IV SOLN
INTRAVENOUS | Status: DC | PRN
  Administered 2014-08-31: 50 mg via INTRAVENOUS

## 2014-08-31 MED ORDER — EZETIMIBE 10 MG PO TABS
10.0000 mg | ORAL_TABLET | Freq: Every day | ORAL | Status: DC
  Administered 2014-08-31 – 2014-09-02 (×3): 10 mg via ORAL
  Filled 2014-08-31 (×3): qty 1

## 2014-08-31 MED ORDER — CEFUROXIME SODIUM 750 MG IJ SOLR
INTRAMUSCULAR | Status: AC
  Filled 2014-08-31: qty 1500

## 2014-08-31 MED ORDER — METOCLOPRAMIDE HCL 5 MG/ML IJ SOLN: 5.0000 mg | Freq: Three times a day (TID) | INTRAMUSCULAR | Status: DC | PRN

## 2014-08-31 MED ORDER — TRANEXAMIC ACID 100 MG/ML IV SOLN
1000.0000 mg | INTRAVENOUS | Status: DC | PRN
  Administered 2014-08-31: 1000 mg via INTRAVENOUS

## 2014-08-31 MED ORDER — SODIUM CHLORIDE 0.9 % IJ SOLN
INTRAMUSCULAR | Status: AC
  Filled 2014-08-31: qty 12

## 2014-08-31 MED ORDER — DEXMEDETOMIDINE HCL 200 MCG/2ML IV SOLN
INTRAVENOUS | Status: DC | PRN
  Administered 2014-08-31: 8 ug via INTRAVENOUS
  Administered 2014-08-31 (×2): 12 ug via INTRAVENOUS

## 2014-08-31 MED ORDER — VECURONIUM BROMIDE 10 MG IV SOLR
INTRAVENOUS | Status: AC
  Filled 2014-08-31: qty 10

## 2014-08-31 MED ORDER — ETOMIDATE 2 MG/ML IV SOLN
INTRAVENOUS | Status: AC
  Filled 2014-08-31: qty 10

## 2014-08-31 MED ORDER — MIDAZOLAM HCL 2 MG/2ML IJ SOLN
INTRAMUSCULAR | Status: AC
  Administered 2014-08-31: 1 mg via INTRAVENOUS
  Filled 2014-08-31: qty 2

## 2014-08-31 MED ORDER — MIDAZOLAM HCL 5 MG/5ML IJ SOLN
INTRAMUSCULAR | Status: DC | PRN
  Administered 2014-08-31: 2 mg via INTRAVENOUS

## 2014-08-31 MED ORDER — OXYCODONE HCL 5 MG PO TABS
ORAL_TABLET | ORAL | Status: AC
  Administered 2014-08-31: 10 mg via ORAL
  Filled 2014-08-31: qty 2

## 2014-08-31 MED ORDER — LIDOCAINE HCL (CARDIAC) 20 MG/ML IV SOLN
INTRAVENOUS | Status: AC
  Filled 2014-08-31: qty 15

## 2014-08-31 MED ORDER — ONDANSETRON HCL 4 MG/2ML IJ SOLN: 4.0000 mg | Freq: Four times a day (QID) | INTRAMUSCULAR | Status: DC | PRN

## 2014-08-31 MED ORDER — PROPOFOL 10 MG/ML IV BOLUS
INTRAVENOUS | Status: AC
  Filled 2014-08-31: qty 20

## 2014-08-31 SURGICAL SUPPLY — 66 items
BANDAGE ESMARK 6X9 LF (GAUZE/BANDAGES/DRESSINGS) ×1 IMPLANT
BLADE SAG 18X100X1.27 (BLADE) ×2 IMPLANT
BLADE SAW SGTL 13X75X1.27 (BLADE) ×2 IMPLANT
BLADE SURG ROTATE 9660 (MISCELLANEOUS) IMPLANT
BNDG CMPR 9X6 STRL LF SNTH (GAUZE/BANDAGES/DRESSINGS) ×1
BNDG CMPR MED 10X6 ELC LF (GAUZE/BANDAGES/DRESSINGS) ×1
BNDG ELASTIC 6X10 VLCR STRL LF (GAUZE/BANDAGES/DRESSINGS) ×2 IMPLANT
BNDG ESMARK 6X9 LF (GAUZE/BANDAGES/DRESSINGS) ×2
BOWL SMART MIX CTS (DISPOSABLE) ×2 IMPLANT
CAP KNEE TOTAL 3 SIGMA ×1 IMPLANT
CEMENT HV SMART SET (Cement) ×4 IMPLANT
COVER SURGICAL LIGHT HANDLE (MISCELLANEOUS) ×2 IMPLANT
CUFF TOURNIQUET SINGLE 34IN LL (TOURNIQUET CUFF) IMPLANT
CUFF TOURNIQUET SINGLE 44IN (TOURNIQUET CUFF) IMPLANT
DRAPE EXTREMITY T 121X128X90 (DRAPE) ×2 IMPLANT
DRAPE IMP U-DRAPE 54X76 (DRAPES) ×2 IMPLANT
DRAPE PROXIMA HALF (DRAPES) ×2 IMPLANT
DRAPE U-SHAPE 47X51 STRL (DRAPES) ×2 IMPLANT
DRSG AQUACEL AG ADV 3.5X14 (GAUZE/BANDAGES/DRESSINGS) ×1 IMPLANT
DURAPREP 26ML APPLICATOR (WOUND CARE) ×4 IMPLANT
ELECT REM PT RETURN 9FT ADLT (ELECTROSURGICAL) ×2
ELECTRODE REM PT RTRN 9FT ADLT (ELECTROSURGICAL) ×1 IMPLANT
EVACUATOR 1/8 PVC DRAIN (DRAIN) ×2 IMPLANT
GAUZE SPONGE 4X4 12PLY STRL (GAUZE/BANDAGES/DRESSINGS) ×4 IMPLANT
GAUZE XEROFORM 1X8 LF (GAUZE/BANDAGES/DRESSINGS) ×2 IMPLANT
GLOVE BIO SURGEON STRL SZ7.5 (GLOVE) ×3 IMPLANT
GLOVE BIO SURGEON STRL SZ8.5 (GLOVE) ×3 IMPLANT
GLOVE BIOGEL PI IND STRL 6.5 (GLOVE) IMPLANT
GLOVE BIOGEL PI IND STRL 8 (GLOVE) ×1 IMPLANT
GLOVE BIOGEL PI IND STRL 9 (GLOVE) ×1 IMPLANT
GLOVE BIOGEL PI INDICATOR 6.5 (GLOVE) ×2
GLOVE BIOGEL PI INDICATOR 8 (GLOVE) ×1
GLOVE BIOGEL PI INDICATOR 9 (GLOVE) ×1
GOWN STRL REUS W/ TWL LRG LVL3 (GOWN DISPOSABLE) ×1 IMPLANT
GOWN STRL REUS W/ TWL XL LVL3 (GOWN DISPOSABLE) ×2 IMPLANT
GOWN STRL REUS W/TWL LRG LVL3 (GOWN DISPOSABLE) ×2
GOWN STRL REUS W/TWL XL LVL3 (GOWN DISPOSABLE) ×4
HANDPIECE INTERPULSE COAX TIP (DISPOSABLE) ×2
HOOD PEEL AWAY FACE SHEILD DIS (HOOD) ×4 IMPLANT
KIT BASIN OR (CUSTOM PROCEDURE TRAY) ×2 IMPLANT
KIT ROOM TURNOVER OR (KITS) ×2 IMPLANT
MANIFOLD NEPTUNE II (INSTRUMENTS) ×2 IMPLANT
NDL SAFETY ECLIPSE 18X1.5 (NEEDLE) IMPLANT
NDL SPNL 18GX3.5 QUINCKE PK (NEEDLE) IMPLANT
NEEDLE 22X1 1/2 (OR ONLY) (NEEDLE) ×2 IMPLANT
NEEDLE HYPO 18GX1.5 SHARP (NEEDLE)
NEEDLE SPNL 18GX3.5 QUINCKE PK (NEEDLE) ×2 IMPLANT
NS IRRIG 1000ML POUR BTL (IV SOLUTION) ×2 IMPLANT
PACK TOTAL JOINT (CUSTOM PROCEDURE TRAY) ×2 IMPLANT
PACK UNIVERSAL I (CUSTOM PROCEDURE TRAY) ×2 IMPLANT
PAD ARMBOARD 7.5X6 YLW CONV (MISCELLANEOUS) ×4 IMPLANT
PADDING CAST COTTON 6X4 STRL (CAST SUPPLIES) ×2 IMPLANT
SET HNDPC FAN SPRY TIP SCT (DISPOSABLE) ×1 IMPLANT
STAPLER VISISTAT 35W (STAPLE) ×2 IMPLANT
SUCTION FRAZIER TIP 10 FR DISP (SUCTIONS) ×2 IMPLANT
SUT VIC AB 0 CT1 27 (SUTURE) ×2
SUT VIC AB 0 CT1 27XBRD ANBCTR (SUTURE) ×1 IMPLANT
SUT VIC AB 1 CTX 36 (SUTURE) ×2
SUT VIC AB 1 CTX36XBRD ANBCTR (SUTURE) ×1 IMPLANT
SUT VIC AB 2-0 CT1 27 (SUTURE) ×2
SUT VIC AB 2-0 CT1 TAPERPNT 27 (SUTURE) ×1 IMPLANT
SYR 30ML LL (SYRINGE) ×2 IMPLANT
SYR 50ML LL SCALE MARK (SYRINGE) ×2 IMPLANT
TOWEL OR 17X24 6PK STRL BLUE (TOWEL DISPOSABLE) ×2 IMPLANT
TOWEL OR 17X26 10 PK STRL BLUE (TOWEL DISPOSABLE) ×2 IMPLANT
WATER STERILE IRR 1000ML POUR (IV SOLUTION) ×4 IMPLANT

## 2014-08-31 NOTE — Transfer of Care (Signed)
Immediate Anesthesia Transfer of Care Note  Patient: Lori Randall  Procedure(s) Performed: Procedure(s): RIGHT TOTAL KNEE ARTHROPLASTY (Right)  Patient Location: PACU  Anesthesia Type:GA combined with regional for post-op pain  Level of Consciousness: oriented, sedated, patient cooperative and responds to stimulation  Airway & Oxygen Therapy: Patient Spontanous Breathing and Patient connected to nasal cannula oxygen  Post-op Assessment: Report given to PACU RN, Post -op Vital signs reviewed and stable, Patient moving all extremities and Patient moving all extremities X 4  Post vital signs: Reviewed and stable  Complications: No apparent anesthesia complications

## 2014-08-31 NOTE — Interval H&P Note (Signed)
History and Physical Interval Note:  08/31/2014 12:18 PM  Lori Randall  has presented today for surgery, with the diagnosis of OSTEOARTHRITIS RIGHT KNEE  The various methods of treatment have been discussed with the patient and family. After consideration of risks, benefits and other options for treatment, the patient has consented to  Procedure(s): RIGHT TOTAL KNEE ARTHROPLASTY (Right) as a surgical intervention .  The patient's history has been reviewed, patient examined, no change in status, stable for surgery.  I have reviewed the patient's chart and labs.  Questions were answered to the patient's satisfaction.     Kerin Salen

## 2014-08-31 NOTE — Progress Notes (Signed)
Orthopedic Tech Progress Note Patient Details:  Lori Randall 1951/03/06 [id number]  CPM Right Knee CPM Right Knee: On Right Knee Flexion (Degrees): 60 Right Knee Extension (Degrees): 0 Additional Comments: applied ohf    Braulio Bosch 08/31/2014, 4:57 PM

## 2014-08-31 NOTE — Anesthesia Postprocedure Evaluation (Signed)
  Anesthesia Post-op Note  Patient: Lori Randall  Procedure(s) Performed: Procedure(s): RIGHT TOTAL KNEE ARTHROPLASTY (Right)  Patient Location:    Anesthesia Type:General  Level of Consciousness: awake and alert   Airway and Oxygen Therapy: Patient Spontanous Breathing and Patient connected to nasal cannula oxygen  Post-op Pain: moderate  Post-op Assessment: Post-op Vital signs reviewed, Patient's Cardiovascular Status Stable, Respiratory Function Stable, Patent Airway and No signs of Nausea or vomiting  Post-op Vital Signs: Reviewed and stable  Last Vitals:  Filed Vitals:   08/31/14 1449  BP:   Pulse:   Temp: 36.8 C  Resp:     Complications: No apparent anesthesia complications

## 2014-08-31 NOTE — Anesthesia Procedure Notes (Addendum)
Anesthesia Regional Block:  Adductor canal block  Pre-Anesthetic Checklist: ,, timeout performed, Correct Patient, Correct Site, Correct Laterality, Correct Procedure, Correct Position, site marked, Risks and benefits discussed,  Surgical consent,  Pre-op evaluation,  At surgeon's request and post-op pain management  Laterality: Right and Lower  Prep: Maximum Sterile Barrier Precautions used       Needles:  Injection technique: Single-shot  Needle Type: Echogenic Stimulator Needle     Needle Length: 10cm 10 cm Needle Gauge: 21 and 21 G    Additional Needles:  Procedures: ultrasound guided (picture in chart) Adductor canal block Narrative:  Start time: 08/31/2014 11:37 AM End time: 08/31/2014 11:48 AM  Performed by: Personally  Anesthesiologist: Alexis Frock  Additional Notes: R AC Canal block with [place], 30 ml of .5% marcaine with epi, picture did not take, multiple asp, talked to patient throughout, no complications   Procedure Name: Intubation Date/Time: 08/31/2014 1:04 PM Performed by: Jacquiline Doe A Pre-anesthesia Checklist: Patient identified, Timeout performed, Emergency Drugs available, Suction available and Patient being monitored Patient Re-evaluated:Patient Re-evaluated prior to inductionOxygen Delivery Method: Circle system utilized Preoxygenation: Pre-oxygenation with 100% oxygen Intubation Type: IV induction and Cricoid Pressure applied Ventilation: Mask ventilation without difficulty and Oral airway inserted - appropriate to patient size Laryngoscope Size: Mac and 4 Grade View: Grade I Tube type: Oral Tube size: 8.0 mm Number of attempts: 1 Airway Equipment and Method: Stylet Placement Confirmation: ETT inserted through vocal cords under direct vision,  breath sounds checked- equal and bilateral and positive ETCO2 Secured at: 22 cm Tube secured with: Tape Dental Injury: Teeth and Oropharynx as per pre-operative assessment

## 2014-08-31 NOTE — Op Note (Signed)
PATIENT ID:      Lori Randall  MRN:     [id number] DOB/AGE:    April 23, 1951 / 64 y.o.       OPERATIVE REPORT    DATE OF PROCEDURE:  08/31/2014       PREOPERATIVE DIAGNOSIS:   OSTEOARTHRITIS RIGHT KNEE      Estimated body mass index is 35.2 kg/(m^2) as calculated from the following:   Height as of this encounter: 5' 6.5" (1.689 m).   Weight as of this encounter: 100.415 kg (221 lb 6 oz).                                                        POSTOPERATIVE DIAGNOSIS:   OSTEOARTHRITIS RIGHT KNEE                                                                      PROCEDURE:  Procedure(s): RIGHT TOTAL KNEE ARTHROPLASTY Using Depuy Sigma RP implants #3R Femur, #4Tibia, 12.9mm Sigma RP bearing, 38 Patella     SURGEON: Jamilah Jean J    ASSISTANT:   [employer]   (Present and scrubbed throughout the case, critical for assistance with exposure, retraction, instrumentation, and closure.)         ANESTHESIA: GET, ACB, Exparel  DRAINS: 2 medium hemovac in knee   TOURNIQUET TIME: 52DPO   COMPLICATIONS:  None     SPECIMENS: None   INDICATIONS FOR PROCEDURE: The patient has  OSTEOARTHRITIS RIGHT KNEE, varus deformities, XR shows bone on bone arthritis. Patient has failed all conservative measures including anti-inflammatory medicines, narcotics, attempts at  exercise and weight loss, cortisone injections and viscosupplementation.  Risks and benefits of surgery have been discussed, questions answered.   DESCRIPTION OF PROCEDURE: The patient identified by armband, received  IV antibiotics, in the holding area at [facility]. Patient taken to the operating room, appropriate anesthetic  monitors were attached, and general endotracheal anesthesia induced with  the patient in supine position, Foley catheter was inserted. Tourniquet  applied high to the operative thigh. Lateral post and foot positioner  applied to the table, the lower extremity was then prepped and draped  in usual  sterile fashion from the ankle to the tourniquet. Time-out procedure was performed. The limb was wrapped with an Esmarch bandage and the tourniquet inflated to 350 mmHg. We began the operation by making the anterior midline incision starting at handbreadth above the patella going over the patella 1 cm medial to and  4 cm distal to the tibial tubercle. Small bleeders in the skin and the  subcutaneous tissue identified and cauterized. Transverse retinaculum was incised and reflected medially and a medial parapatellar arthrotomy was accomplished. the patella was everted and theprepatellar fat pad resected. The superficial medial collateral  ligament was then elevated from anterior to posterior along the proximal  flare of the tibia and anterior half of the menisci resected. The knee was hyperflexed exposing bone on bone arthritis. Peripheral and notch osteophytes as well as the cruciate ligaments were then resected. We continued to  work our  way around posteriorly along the proximal tibia, and externally  rotated the tibia subluxing it out from underneath the femur. A McHale  retractor was placed through the notch and a lateral Hohmann retractor  placed, and we then drilled through the proximal tibia in line with the  axis of the tibia followed by an intramedullary guide rod and 2-degree  posterior slope cutting guide. The tibial cutting guide was pinned into place  allowing resection of 5 mm of bone medially and about 11 mm of bone  laterally because of her varus deformity. Satisfied with the tibial resection, we then  entered the distal femur 2 mm anterior to the PCL origin with the  intramedullary guide rod and applied the distal femoral cutting guide  set at 13mm, with 5 degrees of valgus. This was pinned along the  epicondylar axis. At this point, the distal femoral cut was accomplished without difficulty. We then sized for a #3R femoral component and pinned the guide in 3 degrees of external  rotation.The chamfer cutting guide was pinned into place. The anterior, posterior, and chamfer cuts were accomplished without difficulty followed by  the box cutting guide and the box cut. We also removed posterior osteophytes from the posterior femoral condyles. At this  time, the knee was brought into full extension. We checked our  extension and flexion gaps and found them symmetric at 9mm.  The patella thickness measured at 24 mm. We set the cutting guide at 15 and removed the posterior 9 mm  of the patella, sized for a 38 button and drilled the lollipop. The knee  was then once again hyperflexed exposing the proximal tibia. We sized for a #4 tibial base plate, applied the smokestack and the conical reamer followed by the the Delta fin keel punch. We then hammered into place the Sigma RP trial femoral component, inserted a 10-mm trial bearing, trial patellar button, and took the knee through range of motion from 0-130 degrees. No thumb pressure was required for patellar  tracking. At this point, all trial components were removed, a double batch of DePuy HV cement with 1500 mg of Zinacef was mixed and applied to all bony metallic mating surfaces except for the posterior condyles of the femur itself. In order, we  hammered into place the tibial tray and removed excess cement, the femoral component and removed excess cement, a 10-mm Sigma RP bearing  was inserted, and the knee brought to full extension with compression.  The patellar button was clamped into place, and excess cement  removed. While the cement cured the wound was irrigated out with normal saline solution pulse lavage, and medium Hemovac drains were placed from an anterolateral  approach. Ligament stability and patellar tracking were checked and found to be excellent. The parapatellar arthrotomy was closed with  running #1 Vicryl suture. The subcutaneous tissue with 0 and 2-0 undyed  Vicryl suture, and the skin with skin staples. A  dressing of Xeroform,  4 x 4, dressing sponges, Webril, and Ace wrap applied. The patient  awakened, extubated, and taken to recovery room without difficulty.   Kerin Salen 08/31/2014, 2:08 PM

## 2014-08-31 NOTE — Anesthesia Preprocedure Evaluation (Addendum)
Anesthesia Evaluation  Patient identified by MRN, date of birth, ID band Patient awake    Reviewed: Allergy & Precautions, NPO status , Patient's Chart, lab work & pertinent test results, reviewed documented beta blocker date and time   History of Anesthesia Complications (+) PONV  Airway Mallampati: II   Neck ROM: Full    Dental  (+) Teeth Intact, Dental Advisory Given   Pulmonary sleep apnea ,  breath sounds clear to auscultation        Cardiovascular hypertension, Pt. on medications Rhythm:Regular  EKG 08/2014 WNL   Neuro/Psych    GI/Hepatic negative GI ROS, Neg liver ROS,   Endo/Other  negative endocrine ROS  Renal/GU negative Renal ROS     Musculoskeletal   Abdominal (+) + obese,   Peds  Hematology negative hematology ROS (+)   Anesthesia Other Findings   Reproductive/Obstetrics                            Anesthesia Physical Anesthesia Plan  ASA: II  Anesthesia Plan: General   Post-op Pain Management: MAC Combined w/ Regional for Post-op pain   Induction: Intravenous  Airway Management Planned: Oral ETT and LMA  Additional Equipment:   Intra-op Plan:   Post-operative Plan: Extubation in OR  Informed Consent: I have reviewed the patients History and Physical, chart, labs and discussed the procedure including the risks, benefits and alternatives for the proposed anesthesia with the patient or authorized representative who has indicated his/her understanding and acceptance.     Plan Discussed with:   Anesthesia Plan Comments:         Anesthesia Quick Evaluation

## 2014-09-01 ENCOUNTER — Encounter (HOSPITAL_COMMUNITY): Payer: Self-pay | Admitting: General Practice

## 2014-09-01 LAB — CBC
HCT: 30.9 % — ABNORMAL LOW (ref 36.0–46.0)
Hemoglobin: 10.3 g/dL — ABNORMAL LOW (ref 12.0–15.0)
MCH: 30.7 pg (ref 26.0–34.0)
MCHC: 33.3 g/dL (ref 30.0–36.0)
MCV: 92 fL (ref 78.0–100.0)
Platelets: 202 10*3/uL (ref 150–400)
RBC: 3.36 MIL/uL — AB (ref 3.87–5.11)
RDW: 12.6 % (ref 11.5–15.5)
WBC: 8.4 10*3/uL (ref 4.0–10.5)

## 2014-09-01 NOTE — Evaluation (Signed)
Occupational Therapy Evaluation Patient Details Name: Lori Randall MRN: [id number] DOB: 05-01-1951 Today's Date: 09/01/2014    History of Present Illness s/p RIGHT TOTAL KNEE ARTHROPLASTY    Clinical Impression   Patient independent PTA. Patient currently requires up to max assist for LB ADLs and is overall supervision for functional mobility and transfers. Patient will benefit from acute OT to increase overall independence in the areas of ADLs, functional mobility, and overall safety in order to safely discharge home with husband.     Follow Up Recommendations  No OT follow up;Supervision - Intermittent    Equipment Recommendations  None recommended by OT    Recommendations for Other Services  None at this time     Precautions / Restrictions Precautions Precautions: Fall Restrictions Weight Bearing Restrictions: Yes RLE Weight Bearing: Weight bearing as tolerated      Mobility Bed Mobility General bed mobility comments: Patient up in recliner upon OT entering room; defer to PT evaluation   Transfers Overall transfer level: Needs assistance Equipment used: Rolling walker (2 wheeled) Transfers: Sit to/from Stand Sit to Stand: Supervision    Balance  Defer to PT evaluation     ADL Overall ADL's : Needs assistance/impaired Eating/Feeding: Independent   Grooming: Supervision/safety;Standing   Upper Body Bathing: Set up;Sitting   Lower Body Bathing: Moderate assistance;Sit to/from stand   Upper Body Dressing : Set up;Sitting   Lower Body Dressing: Maximal assistance;Sit to/from stand   Toilet Transfer: Supervision/safety;RW;BSC;Grab bars   Toileting- Water quality scientist and Hygiene: Supervision/safety;Sit to/from stand       Functional mobility during ADLs: Supervision/safety;Rolling walker General ADL Comments: Patient with increased pain when trying to donn socks. Informed patient on AE and plan to educate patient on their uses prior to d/c>home.                 Pertinent Vitals/Pain Pain Assessment: 0-10 Pain Score: 7  Pain Location: R knee Pain Descriptors / Indicators: Aching Pain Intervention(s): Monitored during session;Patient requesting pain meds-RN notified     Hand Dominance Right   Extremity/Trunk Assessment Upper Extremity Assessment Upper Extremity Assessment: Overall WFL for tasks assessed   Lower Extremity Assessment Lower Extremity Assessment: Defer to PT evaluation   Cervical / Trunk Assessment Cervical / Trunk Assessment: Normal   Communication Communication Communication: No difficulties   Cognition Arousal/Alertness: Awake/alert Behavior During Therapy: WFL for tasks assessed/performed Overall Cognitive Status: Within Functional Limits for tasks assessed              Home Living Family/patient expects to be discharged to:: Private residence Living Arrangements: Spouse/significant other Available Help at Discharge: Family;Available 24 hours/day Type of Home: House Home Access: Stairs to enter [employer] of Steps: 3   Home Layout: One level     Bathroom Shower/Tub: Walk-in shower;Door   [employer] Toilet: Standard     Home Equipment: Environmental consultant - 2 wheels;Bedside commode;Shower seat          Prior Functioning/Environment Level of Independence: Independent             OT Diagnosis: Generalized weakness;Acute pain   OT Problem List: Decreased strength;Decreased range of motion;Decreased activity tolerance;Decreased knowledge of use of DME or AE;Pain   OT Treatment/Interventions: Self-care/ADL training;Neuromuscular education;Energy conservation;DME and/or AE instruction;Therapeutic activities;Patient/family education    OT Goals(Current goals can be found in the care plan section) Acute Rehab OT Goals Patient Stated Goal: go home OT Goal Formulation: With patient/family Time For Goal Achievement: 09/08/14 Potential to Achieve Goals: Good ADL  Goals Pt Will  Perform Lower Body Bathing: with modified independence;sit to/from stand;with adaptive equipment Pt Will Perform Lower Body Dressing: with modified independence;sit to/from stand;with adaptive equipment Pt Will Transfer to Toilet: with modified independence;ambulating;bedside commode Pt Will Perform Tub/Shower Transfer: with modified independence;shower seat;rolling walker;ambulating  OT Frequency: Min 2X/week   Barriers to D/C: None known at this time          End of Session Equipment Utilized During Treatment: Rolling walker CPM Right Knee CPM Right Knee: Off  Activity Tolerance: Patient tolerated treatment well Patient left: in chair;with call bell/phone within reach;with family/visitor present   Time: 1000-1012 OT Time Calculation (min): 12 min Charges:  OT Evaluation $Initial OT Evaluation Tier I: 1 Procedure  [name] , MS, OTR/L, CLT Pager: [phone]  09/01/2014, 10:25 AM

## 2014-09-01 NOTE — Progress Notes (Signed)
Orthopedic Tech Progress Note Patient Details:  Lori Randall 01-11-1951 [id number]  Patient ID: Lori Randall, female   DOB: 01/01/1951, 64 y.o.   MRN: [id number] Placed pt's rle in cpm @ 0-60 degrees @1445   Hildred Priest 09/01/2014, 2:44 PM

## 2014-09-01 NOTE — Evaluation (Signed)
Physical Therapy Evaluation Patient Details Name: Lori Randall MRN: [id number] DOB: 21-Jul-1951 Today's Date: 09/01/2014   History of Present Illness  s/p RIGHT TOTAL KNEE ARTHROPLASTY   Clinical Impression  Pt is s/p Rt TKA POD#1 resulting in the deficits listed below (see PT Problem List).  Pt will benefit from skilled PT to increase their independence and safety with mobility to allow discharge to the venue listed below. Patient needs to practice stairs next session prior to D/C.      Follow Up Recommendations Home health PT;Supervision/Assistance - 24 hour    Equipment Recommendations  Other (comment) (reports she has all equipment from previous surgery)    Recommendations for Other Services       Precautions / Restrictions Precautions Precautions: Fall;Knee Precaution Comments: reinforced no pillow under knee Restrictions Weight Bearing Restrictions: Yes RLE Weight Bearing: Weight bearing as tolerated      Mobility  Bed Mobility Overal bed mobility: Needs Assistance Bed Mobility: Sit to Supine       Sit to supine: Min assist   General bed mobility comments: educated on using sheet to (A) Rt LE into bed   Transfers Overall transfer level: Needs assistance Equipment used: Rolling walker (2 wheeled) Transfers: Sit to/from Stand Sit to Stand: Supervision         General transfer comment: cues for hand placement and safety with RW   Ambulation/Gait Ambulation/Gait assistance: Supervision Ambulation Distance (Feet): 100 Feet (80', 20' ) Assistive device: Rolling walker (2 wheeled) Gait Pattern/deviations: Step-to pattern;Decreased stance time - right;Decreased step length - left;Antalgic Gait velocity: decr Gait velocity interpretation: Below normal speed for age/gender General Gait Details: cues for step through gt sequencing and upright posture; pt ambulated in hallway then to bathroom; supervision for safety in bathroom with ADLs; benefited from use of  3 in 1 to push up from elevated surface   Stairs            Wheelchair Mobility    Modified Rankin (Stroke Patients Only)       Balance Overall balance assessment: Needs assistance Sitting-balance support: Feet supported;No upper extremity supported Sitting balance-Leahy Scale: Good Sitting balance - Comments: sat EOB for exercises    Standing balance support: During functional activity;No upper extremity supported;Single extremity supported Standing balance-Leahy Scale: Fair Standing balance comment: stood for brief period of time without UE support                             Pertinent Vitals/Pain Pain Assessment: 0-10 Pain Score: 8  Pain Location: Rt knee Pain Descriptors / Indicators: Aching;Sore Pain Intervention(s): Monitored during session;Premedicated before session;Limited activity within patient's tolerance;Ice applied    Home Living Family/patient expects to be discharged to:: Private residence Living Arrangements: Spouse/significant other Available Help at Discharge: Family;Available 24 hours/day Type of Home: House Home Access: Stairs to enter   [employer] of Steps: 3 Home Layout: One level Home Equipment: Walker - 2 wheels;Bedside commode;Shower seat      Prior Function Level of Independence: Independent               Hand Dominance   Dominant Hand: Right    Extremity/Trunk Assessment   Upper Extremity Assessment: Defer to OT evaluation           Lower Extremity Assessment: RLE deficits/detail RLE Deficits / Details: 5 to 65 degrees in sitting     Cervical / Trunk Assessment: Normal  Communication   Communication:  No difficulties  Cognition Arousal/Alertness: Awake/alert Behavior During Therapy: WFL for tasks assessed/performed Overall Cognitive Status: Within Functional Limits for tasks assessed                      General Comments General comments (skin integrity, edema, etc.): encouraged  OOB activity with nursing ; given HEP handout     Exercises Total Joint Exercises Ankle Circles/Pumps: AROM;Both;10 reps;Seated Quad Sets: AROM;Right;10 reps;Supine Hip ABduction/ADduction: AAROM;Right;10 reps;Supine Long Arc Quad: AROM;Right;10 reps;Seated      Assessment/Plan    PT Assessment Patient needs continued PT services  PT Diagnosis Difficulty walking;Generalized weakness;Acute pain   PT Problem List Decreased strength;Decreased range of motion;Decreased activity tolerance;Decreased balance;Decreased mobility;Decreased knowledge of use of DME;Pain  PT Treatment Interventions DME instruction;Gait training;Stair training;Functional mobility training;Therapeutic activities;Therapeutic exercise;Balance training;Neuromuscular re-education;Patient/family education   PT Goals (Current goals can be found in the Care Plan section) Acute Rehab PT Goals Patient Stated Goal: home tomorrow PT Goal Formulation: With patient Time For Goal Achievement: 09/03/14 Potential to Achieve Goals: Good    Frequency 7X/week   Barriers to discharge        Co-evaluation               End of Session Equipment Utilized During Treatment: Gait belt Activity Tolerance: Patient tolerated treatment well Patient left: in bed;with call bell/phone within reach Nurse Communication: Mobility status         Time: 4142-3953 PT Time Calculation (min) (ACUTE ONLY): 25 min   Charges:   PT Evaluation $Initial PT Evaluation Tier I: 1 Procedure PT Treatments $Gait Training: 8-22 mins   PT G CodesGustavus Bryant, [place]  [phone] 09/01/2014, 2:44 PM

## 2014-09-01 NOTE — Progress Notes (Signed)
Utilization review completed. Darrell Hauk, RN, BSN. 

## 2014-09-01 NOTE — Progress Notes (Signed)
Patient ID: Lori Randall, female   DOB: [birth year], 64 y.o.   MRN: [id number] PATIENT ID: Lori Randall  MRN: [id number]  DOB/AGE:  1951/08/02 / 64 y.o.  1 Day Post-Op Procedure(s) (LRB): RIGHT TOTAL KNEE ARTHROPLASTY (Right)    PROGRESS NOTE Subjective: Patient is alert, oriented, no Nausea, no Vomiting, yes passing gas, no Bowel Movement. Taking PO well. Denies SOB, Chest or Calf Pain. Using Incentive Spirometer, PAS in place. Ambulate WBAT, CPM 0-40 Patient reports pain as 4 on 0-10 scale  .    Objective: Vital signs in last 24 hours: Filed Vitals:   08/31/14 2200 08/31/14 2330 09/01/14 0100 09/01/14 0600  BP: 135/68  135/63 108/59  Pulse: 93  94 102  Temp: 98.1 F (36.7 C)  98.2 F (36.8 C) 98.7 F (37.1 C)  TempSrc:      Resp: 16 16 16 16   Height:      Weight:      SpO2: 96%  94% 94%      Intake/Output from previous day: I/O last 3 completed shifts: In: 2865 [P.O.:240; I.V.:2625] Out: 1325 [Urine:700; Drains:525; Blood:100]   Intake/Output this shift:     LABORATORY DATA: No results for input(s): WBC, HGB, HCT, PLT, NA, K, CL, CO2, BUN, CREATININE, GLUCOSE, GLUCAP, INR, CALCIUM in the last 72 hours.  Invalid input(s): PT, 2  Examination: Neurologically intact ABD soft Neurovascular intact Sensation intact distally Intact pulses distally Dorsiflexion/Plantar flexion intact Incision: dressing C/D/I No cellulitis present Compartment soft} Blood and plasma separated in drain indicating minimal recent drainage, drain pulled without difficulty.  Assessment:   1 Day Post-Op Procedure(s) (LRB): RIGHT TOTAL KNEE ARTHROPLASTY (Right) ADDITIONAL DIAGNOSIS: Expected Acute Blood Loss Anemia,   Plan: PT/OT WBAT, CPM 5/hrs day until ROM 0-90 degrees, then D/C CPM DVT Prophylaxis:  SCDx72hrs, ASA 325 mg BID x 2 weeks DISCHARGE PLAN: Home DISCHARGE NEEDS: HHPT, CPM, Walker and 3-in-1 comode seat     Icis Budreau J 09/01/2014, 7:50 AM

## 2014-09-01 NOTE — Progress Notes (Signed)
Patient requested to ambulate to the bathroom per MD's orders (according to a conversation she said she had with the MD). NT attempted to ambulate patient to the bathroom with no success. Patient stated that she could not do it at the moment and stated that she would try later. Nursing will continue to monitor.

## 2014-09-01 NOTE — Progress Notes (Signed)
09/01/14 Set up with [facility] for HHPT, OT and RN by MD office. Spoke with patient, no change in discharge plan. Spoke with Ruby Cola from [facility], they will be providing CPM, patient already has rolling walker and 3N1. Patient stated that her husband will assist after d/c.Will follow until d/c.

## 2014-09-02 LAB — CBC
HCT: 32.3 % — ABNORMAL LOW (ref 36.0–46.0)
HEMOGLOBIN: 10.9 g/dL — AB (ref 12.0–15.0)
MCH: 31.7 pg (ref 26.0–34.0)
MCHC: 33.7 g/dL (ref 30.0–36.0)
MCV: 93.9 fL (ref 78.0–100.0)
Platelets: 175 10*3/uL (ref 150–400)
RBC: 3.44 MIL/uL — ABNORMAL LOW (ref 3.87–5.11)
RDW: 12.6 % (ref 11.5–15.5)
WBC: 6.1 10*3/uL (ref 4.0–10.5)

## 2014-09-02 NOTE — Progress Notes (Signed)
Physical Therapy Treatment Patient Details Name: Lori Randall MRN: [id number] DOB: 1950-09-12 Today's Date: 09/02/2014    History of Present Illness s/p RIGHT TOTAL KNEE ARTHROPLASTY     PT Comments    Pt at supervision to mod I for mobility. Educated pt and husband on stair management, provided handout for carryover. Reviewed HEP handout and practiced exercises with pt. Safe from mobility standpoint to D/C home.   Follow Up Recommendations  Home health PT;Supervision/Assistance - 24 hour     Equipment Recommendations  Other (comment)    Recommendations for Other Services       Precautions / Restrictions Precautions Precautions: Knee Precaution Comments: reinforced no pillow under knee Restrictions Weight Bearing Restrictions: Yes RLE Weight Bearing: Weight bearing as tolerated    Mobility  Bed Mobility               General bed mobility comments: up with OT  Transfers Overall transfer level: Modified independent Equipment used: Rolling walker (2 wheeled) Transfers: Sit to/from Stand Sit to Stand: Supervision         General transfer comment: mod I when transferring to/from 3 in 1 and recliner   Ambulation/Gait Ambulation/Gait assistance: Supervision Ambulation Distance (Feet): 200 Feet Assistive device: Rolling walker (2 wheeled) Gait Pattern/deviations: Step-through pattern;Antalgic Gait velocity: decr Gait velocity interpretation: Below normal speed for age/gender General Gait Details: slightly antalgic gt at times when attempting to incr WB through [place]; cues for RW safety   Stairs Stairs: Yes Stairs assistance: Min guard Stair Management: No rails;Step to pattern;Backwards;With walker Number of Stairs: 6 (2 x 3) General stair comments: pt anxious with stair training; educated pt and husband on technique; provided handout   Wheelchair Mobility    Modified Rankin (Stroke Patients Only)       Balance Overall balance assessment: No  apparent balance deficits (not formally assessed)                                  Cognition Arousal/Alertness: Awake/alert Behavior During Therapy: WFL for tasks assessed/performed Overall Cognitive Status: Within Functional Limits for tasks assessed                      Exercises Total Joint Exercises Ankle Circles/Pumps: AROM;Both;10 reps;Seated Quad Sets: AROM;Right;10 reps;Seated Heel Slides: AAROM;Right;10 reps;Seated Hip ABduction/ADduction: AAROM;Right;10 reps;Seated Long Arc Quad: AROM;Right;10 reps;Seated Goniometric ROM: 0 to 65 degrees    General Comments General comments (skin integrity, edema, etc.): reviewed HEP handout       Pertinent Vitals/Pain Pain Assessment: 0-10 Pain Score: 6  Pain Location: Rt knee Pain Descriptors / Indicators: Sore Pain Intervention(s): Monitored during session;Premedicated before session;Repositioned    Home Living                      Prior Function            PT Goals (current goals can now be found in the care plan section) Acute Rehab PT Goals Patient Stated Goal: home after this PT Goal Formulation: With patient Time For Goal Achievement: 09/03/14 Potential to Achieve Goals: Good Progress towards PT goals: Progressing toward goals    Frequency  7X/week    PT Plan Current plan remains appropriate    Co-evaluation             End of Session           Time: 321 722 2924  PT Time Calculation (min) (ACUTE ONLY): 24 min  Charges:  $Gait Training: 8-22 mins $Therapeutic Exercise: 8-22 mins                    G Codes:      Gustavus Bryant, [place]  [phone] 09/02/2014, 9:28 AM

## 2014-09-02 NOTE — Discharge Summary (Signed)
Patient ID: Lori Randall MRN: [id number] DOB/AGE: [birth year] 64 y.o.  Admit date: 08/31/2014 Discharge date: 09/02/2014  Admission Diagnoses:  Active Problems:   Primary osteoarthritis of right knee   Discharge Diagnoses:  Same  Past Medical History  Diagnosis Date  . Hypertension   . Arthritis   . Sleep apnea   . Hx of radiation therapy 03/11/13-04/27/13  . Breast cancer 01/13/13    right breast bx=invasive ca   . Complication of anesthesia   . PONV (postoperative nausea and vomiting)     2014 after breast surgery  . Shortness of breath dyspnea     with exertion     Surgeries: Procedure(s): RIGHT TOTAL KNEE ARTHROPLASTY on 08/31/2014   Consultants:    Discharged Condition: Improved  Hospital Course: Lori Randall is an 64 y.o. female who was admitted 08/31/2014 for operative treatment of<principal problem not specified>. Patient has severe unremitting pain that affects sleep, daily activities, and work/hobbies. After pre-op clearance the patient was taken to the operating room on 08/31/2014 and underwent  Procedure(s): RIGHT TOTAL KNEE ARTHROPLASTY.    Patient was given perioperative antibiotics: Anti-infectives    Start     Dose/Rate Route Frequency Ordered Stop   08/31/14 1251  cefUROXime (ZINACEF) injection  Status:  Discontinued       As needed 08/31/14 1251 08/31/14 1446   08/31/14 0600  ceFAZolin (ANCEF) IVPB 2 g/50 mL premix     2 g100 mL/hr over 30 Minutes Intravenous On call to O.R. 08/30/14 1224 08/31/14 1315       Patient was given sequential compression devices, early ambulation, and chemoprophylaxis to prevent DVT.  Patient benefited maximally from hospital stay and there were no complications.    Recent vital signs: Patient Vitals for the past 24 hrs:  BP Temp Pulse Resp SpO2  09/02/14 0800 - - - 16 -  09/02/14 0600 (!) 146/59 mmHg 99.2 F (37.3 C) (!) 105 16 93 %  09/02/14 0400 - - - 16 93 %  09/02/14 0000 - - - 16 93 %  09/01/14 2200 (!)  140/52 mmHg 98.8 F (37.1 C) (!) 105 16 93 %  09/01/14 2000 - - - 16 93 %  09/01/14 1621 (!) 119/49 mmHg 99 F (37.2 C) (!) 102 16 99 %     Recent laboratory studies:  Recent Labs  09/01/14 1420  WBC 8.4  HGB 10.3*  HCT 30.9*  PLT 202     Discharge Medications:     Medication List    STOP taking these medications        acetaminophen 325 MG tablet  Commonly known as:  TYLENOL     HYDROcodone-acetaminophen 5-325 MG per tablet  Commonly known as:  NORCO/VICODIN      TAKE these medications        anastrozole 1 MG tablet  Commonly known as:  ARIMIDEX  TAKE 1 TABLET DAILY     aspirin EC 325 MG tablet  Take 1 tablet (325 mg total) by mouth 2 (two) times daily.     atorvastatin 80 MG tablet  Commonly known as:  LIPITOR  Take 80 mg by mouth daily.     ezetimibe 10 MG tablet  Commonly known as:  ZETIA  Take 10 mg by mouth daily.     fish oil-omega-3 fatty acids 1000 MG capsule  Take 1 g by mouth daily.     hydrochlorothiazide 25 MG tablet  Commonly known as:  HYDRODIURIL  Take 25 mg  by mouth daily.     methocarbamol 500 MG tablet  Commonly known as:  ROBAXIN  Take 1 tablet (500 mg total) by mouth 2 (two) times daily with a meal.     oxyCODONE-acetaminophen 5-325 MG per tablet  Commonly known as:  ROXICET  Take 1 tablet by mouth every 4 (four) hours as needed.     ramipril 5 MG capsule  Commonly known as:  ALTACE  Take 5 mg by mouth daily.     Vitamin D3 2000 UNITS Tabs  Take 2,000 Units by mouth daily.        Diagnostic Studies: Dg Chest 2 View  08/22/2014   CLINICAL DATA:  Preop.  EXAM: CHEST  2 VIEW  COMPARISON:  02/01/2013.  FINDINGS: Trachea is midline. Heart size normal. Lungs are clear. No pleural fluid. Surgical clips in the right breast.  IMPRESSION: No acute findings.   Electronically Signed   By: Lorin Picket M.D.   On: 08/22/2014 16:44    Disposition: 01-Home or Self Care      Discharge Instructions    CPM    Complete by:  As  directed   Continuous passive motion machine (CPM):      Use the CPM from 0 to 60  for 5 hours per day.      You may increase by 10 degrees per day.  You may break it up into 2 or 3 sessions per day.      Use CPM for 2 weeks or until you are told to stop.     Call MD / Call 911    Complete by:  As directed   If you experience chest pain or shortness of breath, CALL 911 and be transported to the hospital emergency room.  If you develope a fever above 101 F, pus (white drainage) or increased drainage or redness at the wound, or calf pain, call your surgeon's office.     Change dressing    Complete by:  As directed   Change dressing on 5, then change the dressing daily with sterile 4 x 4 inch gauze dressing and apply TED hose.  You may clean the incision with alcohol prior to redressing.     Constipation Prevention    Complete by:  As directed   Drink plenty of fluids.  Prune juice may be helpful.  You may use a stool softener, such as Colace (over the counter) 100 mg twice a day.  Use MiraLax (over the counter) for constipation as needed.     Diet - low sodium heart healthy    Complete by:  As directed      Discharge instructions    Complete by:  As directed   Follow up in office with Dr. Mayer Camel in 2 weeks.     Driving restrictions    Complete by:  As directed   No driving for 2 weeks     Increase activity slowly as tolerated    Complete by:  As directed      Patient may shower    Complete by:  As directed   You may shower without a dressing once there is no drainage.  Do not wash over the wound.  If drainage remains, cover wound with plastic wrap and then shower.           Follow-up Information    Follow up with Kerin Salen, MD In 2 weeks.   Specialty:  Orthopedic Surgery   Contact information:   [phone]  [place] [address] [phone]       Follow up with [facility].   Why:  They will contact you to schedule home therapy visits.    Contact  information:   [place] [phone]        Signed: Theodosia Quay 09/02/2014, 8:13 AM

## 2014-09-02 NOTE — Progress Notes (Addendum)
Occupational Therapy Treatment Patient Details Name: Lori Randall MRN: [id number] DOB: 09-02-1950 Today's Date: 09/02/2014    History of present illness s/p RIGHT TOTAL KNEE ARTHROPLASTY    OT comments  Pt. Progressing with acute OT goals and is ready for d/c.  Able to complete LB ADLS with use of A/E, and performing safe toilet and shower stall transfers.  OTR/L to sign off.    Follow Up Recommendations  No OT follow up;Supervision - Intermittent    Equipment Recommendations  None recommended by OT    Recommendations for Other Services      Precautions / Restrictions Precautions Precautions: Fall;Knee Restrictions Weight Bearing Restrictions: Yes RLE Weight Bearing: Weight bearing as tolerated                      General bed mobility comments: up in recliner upon arrival  Transfers Overall transfer level: Needs assistance Equipment used: Rolling walker (2 wheeled) Transfers: Sit to/from Stand Sit to Stand: Supervision                                                 ADL Overall ADL's : Needs assistance/impaired     Grooming: Brushing hair;Supervision/safety;Standing       Lower Body Bathing: Supervison/ safety;Sit to/from stand Lower Body Bathing Details (indicate cue type and reason): simulated with demo of LH sponge Upper Body Dressing : Set up;Sitting   Lower Body Dressing: Supervision/safety;With adaptive equipment;Cueing for sequencing;Sit to/from stand Lower Body Dressing Details (indicate cue type and reason): able to don underwear with use of reacher   Toilet Transfer Details (indicate cue type and reason): pt. reports completed with S with nursing staff     Tub/ Shower Transfer: Walk-in shower;Shower seat;Min Chief Executive Officer Details (indicate cue type and reason): pt. able to return demo of stepping over ledge into walk in shower min guard a   General ADL Comments: pt. reports feeling better today, doing  well with use of A/E and able to complete safe return demo of shower stall transfer                                      Cognition   Behavior During Therapy: [facility] for tasks assessed/performed Overall Cognitive Status: Within Functional Limits for tasks assessed                                                  General Comments      Pertinent Vitals/ Pain       Pain Score: 3  Pain Location: R knee Pain Intervention(s): Premedicated before session                                                          Frequency Min 2X/week     Progress Toward Goals  OT Goals(current goals can now be found in the care plan section)  Progress towards OT goals: Goals met/education completed, patient  discharged from [place] Discharge plan remains appropriate                     End of Session Equipment Utilized During Treatment: Rolling walker CPM Right Knee CPM Right Knee: Off Right Knee Flexion (Degrees): 60 Right Knee Extension (Degrees): 0   Activity Tolerance Patient tolerated treatment well   Patient Left Other (comment) (with PT at end of session)             Time: 0221-7981 OT Time Calculation (min): 11 min  Charges: OT General Charges $OT Visit: 1 Procedure OT Treatments $Self Care/Home Management : 8-22 mins  Janice Coffin, COTA/L 09/02/2014, 9:04 AM

## 2014-09-02 NOTE — Progress Notes (Signed)
PATIENT ID: Lori Randall  MRN: [id number]  DOB/AGE:  65-Oct-1952 / 64 y.o.  2 Days Post-Op Procedure(s) (LRB): RIGHT TOTAL KNEE ARTHROPLASTY (Right)    PROGRESS NOTE Subjective: Patient is alert, oriented, no Nausea, no Vomiting, yes passing gas, no Bowel Movement. Taking PO well. Denies SOB, Chest or Calf Pain. Using Incentive Spirometer, PAS in place. Ambulate WBAT, CPM 0-60 Patient reports pain as 2 on 0-10 scale  .    Objective: Vital signs in last 24 hours: Filed Vitals:   09/01/14 2200 09/02/14 0000 09/02/14 0400 09/02/14 0600  BP: 140/52   146/59  Pulse: 105   105  Temp: 98.8 F (37.1 C)   99.2 F (37.3 C)  TempSrc:      Resp: _0 Height:      Weight:      SpO2: 93% 93% 93% 93%      Intake/Output from previous day: I/O last 3 completed shifts: In: 1595 [P.O.:720; I.V.:875] Out: 1075 [Urine:700; Drains:375]   Intake/Output this shift:     LABORATORY DATA:  Recent Labs  09/01/14 1420  WBC 8.4  HGB 10.3*  HCT 30.9*  PLT 202    Examination: Neurologically intact Neurovascular intact Sensation intact distally Intact pulses distally Dorsiflexion/Plantar flexion intact Incision: dressing C/D/I No cellulitis present Compartment soft}  Assessment:   2 Days Post-Op Procedure(s) (LRB): RIGHT TOTAL KNEE ARTHROPLASTY (Right) ADDITIONAL DIAGNOSIS: Expected Acute Blood Loss Anemia,   Plan: PT/OT WBAT, CPM 5/hrs day until ROM 0-90 degrees, then D/C CPM DVT Prophylaxis:  SCDx72hrs, ASA 325 mg BID x 2 weeks DISCHARGE PLAN: Home today once therapy goals are met DISCHARGE NEEDS: HHPT, HHRN, CPM, Walker and 3-in-1 comode seat     PHILLIPS, ERIC R 09/02/2014, 7:35 AM

## 2014-09-22 ENCOUNTER — Telehealth: Payer: Self-pay | Admitting: Oncology

## 2014-09-22 ENCOUNTER — Ambulatory Visit (HOSPITAL_BASED_OUTPATIENT_CLINIC_OR_DEPARTMENT_OTHER): Payer: BLUE CROSS/BLUE SHIELD | Admitting: Oncology

## 2014-09-22 ENCOUNTER — Other Ambulatory Visit (HOSPITAL_BASED_OUTPATIENT_CLINIC_OR_DEPARTMENT_OTHER): Payer: BLUE CROSS/BLUE SHIELD

## 2014-09-22 VITALS — BP 146/74 | HR 89 | Temp 98.3°F | Resp 18 | Ht 66.5 in | Wt 218.8 lb

## 2014-09-22 DIAGNOSIS — C50411 Malignant neoplasm of upper-outer quadrant of right female breast: Secondary | ICD-10-CM

## 2014-09-22 DIAGNOSIS — Z170 Estrogen receptor positive status [ER+]: Secondary | ICD-10-CM

## 2014-09-22 LAB — COMPREHENSIVE METABOLIC PANEL (CC13)
ALBUMIN: 4.1 g/dL (ref 3.5–5.0)
ALK PHOS: 163 U/L — AB (ref 40–150)
ALT: 26 U/L (ref 0–55)
AST: 21 U/L (ref 5–34)
Anion Gap: 9 mEq/L (ref 3–11)
BUN: 12.4 mg/dL (ref 7.0–26.0)
CO2: 28 mEq/L (ref 22–29)
Calcium: 11.3 mg/dL — ABNORMAL HIGH (ref 8.4–10.4)
Chloride: 102 mEq/L (ref 98–109)
Creatinine: 0.7 mg/dL (ref 0.6–1.1)
EGFR: >90 mL/min/{1.73_m2} (ref >90)
Glucose: 98 mg/dl (ref 70–140)
Potassium: 4.1 mEq/L (ref 3.5–5.1)
Sodium: 139 mEq/L (ref 136–145)
Total Bilirubin: 0.43 mg/dL (ref 0.20–1.20)
Total Protein: 7.8 g/dL (ref 6.4–8.3)

## 2014-09-22 LAB — CBC WITH DIFFERENTIAL/PLATELET
BASO%: 0.9 % (ref 0.0–2.0)
Basophils Absolute: 0 10*3/uL (ref 0.0–0.1)
EOS%: 2.4 % (ref 0.0–7.0)
Eosinophils Absolute: 0.1 10*3/uL (ref 0.0–0.5)
HCT: 39.1 % (ref 34.8–46.6)
HEMOGLOBIN: 12.8 g/dL (ref 11.6–15.9)
LYMPH%: 29.6 % (ref 14.0–49.7)
MCH: 30 pg (ref 25.1–34.0)
MCHC: 32.7 g/dL (ref 31.5–36.0)
MCV: 91.7 fL (ref 79.5–101.0)
MONO#: 0.5 10*3/uL (ref 0.1–0.9)
MONO%: 8.7 % (ref 0.0–14.0)
NEUT#: 3.1 10*3/uL (ref 1.5–6.5)
NEUT%: 58.4 % (ref 38.4–76.8)
Platelets: 234 10*3/uL (ref 145–400)
RBC: 4.27 10*6/uL (ref 3.70–5.45)
RDW: 13.1 % (ref 11.2–14.5)
WBC: 5.3 10*3/uL (ref 3.9–10.3)
lymph#: 1.6 10*3/uL (ref 0.9–3.3)

## 2014-09-22 MED ORDER — ANASTROZOLE 1 MG PO TABS: 1.0000 mg | ORAL_TABLET | Freq: Every day | ORAL | Status: DC

## 2014-09-22 NOTE — Addendum Note (Signed)
Addended by: Laureen Abrahams on: 09/22/2014 04:17 PM   Modules accepted: Orders, Medications

## 2014-09-22 NOTE — Progress Notes (Signed)
ID: Lori Randall OB: 07-03-1951  MR#: [id number]  YIR#:[id number]  PCP: Osborne Casco, MD GYN:   SU: Fanny Skates OTHER MD: Kelton Pillar, Christene Slates, Kyra Searles  CHIEF COMPLAINT: right breast cancer  CURRENT THERAPY: anastrozole   BREAST CANCER HISTORY: From the earlier summary:  "Lori Randall" had routine screening mammography at [facility] 01/11/2013 showing a potential abnormality in the left breast. Additional views 01/13/2013 found that the calcifications noted in the left breast were likely benign. However on the right a previously noted mass was now irregular in contour. There were also some associated calcifications. A right breast ultrasound found a hypoechoic mass collar than wide measuring 1.2 cm. Biopsy of this mass the same day ([id number]) showed an invasive ductal carcinoma, grade 1, estrogen receptor 100% positive, progesterone receptor 86% positive, with an MIB-1 of 14% and no HER-2 amplification.  Bilateral breast MRIs 01/25/2013 showed a 2.1 cm lobulated enhancing mass in the posterior third of the upper outer quadrant of the right breast there were no other areas of concern in either breast and no enlarged axillary or internal mammary adenopathy.  The patient's subsequent history is as detailed below   INTERVAL HISTORY: Lori Randall returns today for follow up of her right breast cancer. Interval history is unremarkable. She is tolerating the anastrozole with only occasional hot flashes and no change in vaginal dryness. She never developed arthralgias or myalgias related to this medication. She obtains it at an excellent cost.  REVIEW OF SYSTEMS: She had a right knee replacement late January of this year. She tells me rehabilitation is going well. She is taking oxycodone and Dulcolax. This is making her a little "loopy", but she is not constipated and she is hoping to get off the pain medication within the next few weeks. A detailed review of systems today  was otherwise stable.  PAST MEDICAL HISTORY: Past Medical History  Diagnosis Date  . Hypertension   . Arthritis   . Sleep apnea   . Hx of radiation therapy 03/11/13-04/27/13  . Breast cancer 01/13/13    right breast bx=invasive ca   . Complication of anesthesia   . PONV (postoperative nausea and vomiting)     2014 after breast surgery  . Shortness of breath dyspnea     with exertion     PAST SURGICAL HISTORY: Past Surgical History  Procedure Laterality Date  . Excisional biopsy right breast    . Partial mastectomy with needle localization and axillary sentinel lymph node bx Right 02/01/2013    Procedure: PARTIAL MASTECTOMY WITH NEEDLE LOCALIZATION AND AXILLARY SENTINEL LYMPH NODE BX;  Surgeon: Adin Hector, MD;  Location: [place];  Service: General;  Laterality: Right;  needle localization at 7:30 SOLIS nuclear medicine 30 minutes prior to surgery right breast 9:30  . Breast surgery    . Appendectomy      age 81  . Colonoscopy w/ polypectomy    . Total knee arthroplasty Right 08/31/2014    dr Mayer Camel  . Total knee arthroplasty Right 08/31/2014    Procedure: RIGHT TOTAL KNEE ARTHROPLASTY;  Surgeon: Kerin Salen, MD;  Location: [place];  Service: Orthopedics;  Laterality: Right;    FAMILY HISTORY No family history on file. The patient's father died at the age of 17 with congestive 4 Lovena Le in the setting of severe dementia. The patient's mother died at age 23 with atypical parkinsonism. The patient has 3 brothers and 2 sisters. There is no history of breast or ovarian cancer  in the family  GYNECOLOGIC HISTORY:  Menarche age 31, first live birth age 83. The patient is GX P2. She went through menopause approximately 2004. She took hormone replacement approximately 2 years. She took birth control remotely for approximately 10 years, without complications.  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 Her husband Gearldine Shown") M. Surveyor, minerals used to work for [employer]. He is now  retired. Daughter Deiona Hooper is a paramedic in [facility]. Son Jasime Westergren is a landscaper in [place]. The patient has no grandchildren. She attends a [employer]    ADVANCED DIRECTIVES: In place.   HEALTH MAINTENANCE: History  Substance Use Topics  . Smoking status: Never Smoker   . Smokeless tobacco: Never Used  . Alcohol Use: No     Comment: maybe 1 glass of wine a month     Colonoscopy: 2005  PAP: November 2014  Bone density: 06/09/2013 at [facility]; normal  Lipid panel:  No Known Allergies  Current Outpatient Prescriptions  Medication Sig Dispense Refill  . anastrozole (ARIMIDEX) 1 MG tablet TAKE 1 TABLET DAILY 90 tablet 12  . aspirin EC 325 MG tablet Take 1 tablet (325 mg total) by mouth 2 (two) times daily. 30 tablet 0  . atorvastatin (LIPITOR) 80 MG tablet Take 80 mg by mouth daily.    . Cholecalciferol (VITAMIN D3) 2000 UNITS TABS Take 2,000 Units by mouth daily.    Marland Kitchen ezetimibe (ZETIA) 10 MG tablet Take 10 mg by mouth daily.    . fish oil-omega-3 fatty acids 1000 MG capsule Take 1 g by mouth daily.    . hydrochlorothiazide (HYDRODIURIL) 25 MG tablet Take 25 mg by mouth daily.    . methocarbamol (ROBAXIN) 500 MG tablet Take 1 tablet (500 mg total) by mouth 2 (two) times daily with a meal. 60 tablet 0  . oxyCODONE-acetaminophen (ROXICET) 5-325 MG per tablet Take 1 tablet by mouth every 4 (four) hours as needed. 60 tablet 0  . ramipril (ALTACE) 5 MG capsule Take 5 mg by mouth daily.     No current facility-administered medications for this visit.    OBJECTIVE: Middle-aged white woman examined in a chair Filed Vitals:   09/22/14 1336  BP: 146/74  Pulse: 89  Temp: 98.3 F (36.8 C)  Resp: 18     Body mass index is 34.79 kg/(m^2).    ECOG FS: 2  Sclerae unicteric, pupils equal and reactive Oropharynx clear, dentition in good repair No cervical or supraclavicular adenopathy Lungs no rales or rhonchi Heart regular rate and rhythm Abd  soft, obese, nontender, positive bowel sounds MSK no focal spinal tenderness Neuro: nonfocal, well oriented, positive affect Breasts: The right breast is status post lumpectomy and radiation. The right axilla is benign. The left breast is unremarkable   LAB RESULTS:  CMP     Component Value Date/Time   NA 139 09/22/2014 1310   NA 140 08/22/2014 1402   K 4.1 09/22/2014 1310   K 4.0 08/22/2014 1402   CL 104 08/22/2014 1402   CL 105 01/27/2013 1208   CO2 28 09/22/2014 1310   CO2 27 08/22/2014 1402   GLUCOSE 98 09/22/2014 1310   GLUCOSE 117* 08/22/2014 1402   GLUCOSE 123* 01/27/2013 1208   BUN 12.4 09/22/2014 1310   BUN 9 08/22/2014 1402   CREATININE 0.7 09/22/2014 1310   CREATININE 0.63 08/22/2014 1402   CALCIUM 11.3* 09/22/2014 1310   CALCIUM 10.9* 08/22/2014 1402   PROT 7.8  09/22/2014 1310   PROT 6.8 03/15/2014 1255   ALBUMIN 4.1 09/22/2014 1310   ALBUMIN 4.5 03/15/2014 1255   AST 21 09/22/2014 1310   AST 25 03/15/2014 1255   ALT 26 09/22/2014 1310   ALT 33 03/15/2014 1255   ALKPHOS 163* 09/22/2014 1310   ALKPHOS 119* 03/15/2014 1255   BILITOT 0.43 09/22/2014 1310   BILITOT 0.6 03/15/2014 1255   GFRNONAA >90 08/22/2014 1402   GFRAA >90 08/22/2014 1402    I No results found for: SPEP  Lab Results  Component Value Date   WBC 5.3 09/22/2014   NEUTROABS 3.1 09/22/2014   HGB 12.8 09/22/2014   HCT 39.1 09/22/2014   MCV 91.7 09/22/2014   PLT 234 09/22/2014      Chemistry      Component Value Date/Time   NA 139 09/22/2014 1310   NA 140 08/22/2014 1402   K 4.1 09/22/2014 1310   K 4.0 08/22/2014 1402   CL 104 08/22/2014 1402   CL 105 01/27/2013 1208   CO2 28 09/22/2014 1310   CO2 27 08/22/2014 1402   BUN 12.4 09/22/2014 1310   BUN 9 08/22/2014 1402   CREATININE 0.7 09/22/2014 1310   CREATININE 0.63 08/22/2014 1402      Component Value Date/Time   CALCIUM 11.3* 09/22/2014 1310   CALCIUM 10.9* 08/22/2014 1402   ALKPHOS 163* 09/22/2014 1310   ALKPHOS  119* 03/15/2014 1255   AST 21 09/22/2014 1310   AST 25 03/15/2014 1255   ALT 26 09/22/2014 1310   ALT 33 03/15/2014 1255   BILITOT 0.43 09/22/2014 1310   BILITOT 0.6 03/15/2014 1255       No results found for: LABCA2  No components found for: LABCA125  No results for input(s): INR in the last 168 hours.  Urinalysis    Component Value Date/Time   COLORURINE YELLOW 08/22/2014 1402    STUDIES: Most recent mammogram performed on 01/17/14 was unremarkable  Most recent bone density scan showed a t-score of -0.3 (normal)  ASSESSMENT: 64 y.o. [place], [place] woman status post right breast biopsy 01/13/2013 for a clinical T2 N0, stage IIA invasive ductal carcinoma, grade 1, estrogen receptor 100% positive, progesterone receptor 86% positive, with an MIB-1 of 14% and no HER-2 amplification  (1) status post right lumpectomy and sentinel lymph node dissection 02/01/2013 for a pT2 pN0, stage IIA invasive ductal carcinoma, grade 1, with repeat HER-2 negative and ample margins.  (2) Oncotype DX score of 13 predicts a risk of distant recurrence of 8% within 10 years if the patient's only systemic therapy is tamoxifen for 5 years. It also predicts no significant benefit from chemotherapy  (3) adjuvant radiation completed 04/27/2013  (4) started anastrozole 06/05/2013  (a) bone density scan November 2014 was normal  PLAN: Juliann Pulse is doing fine as far as her breast cancer is concerned, now nearly 2 years out from her definitive surgery with no evidence of disease recurrence. The overall plan is to continue anastrozole for a total of 5 years. She had a normal bone density at presentation and we will probably want to repeat that late this year or early next year. Her Manson Allan otherwise she knows to call for any problems that may develop before her next visit here. Chauncey Cruel, MD   09/22/2014 2:09 PM

## 2014-09-22 NOTE — Telephone Encounter (Signed)
per pof to sch pt appt-gave pt copy of sch °

## 2015-01-05 ENCOUNTER — Other Ambulatory Visit: Payer: Self-pay

## 2015-01-05 DIAGNOSIS — C50411 Malignant neoplasm of upper-outer quadrant of right female breast: Secondary | ICD-10-CM

## 2015-01-05 MED ORDER — ANASTROZOLE 1 MG PO TABS: 1.0000 mg | ORAL_TABLET | Freq: Every day | ORAL | Status: DC

## 2015-02-10 ENCOUNTER — Other Ambulatory Visit: Payer: Self-pay | Admitting: *Deleted

## 2015-02-10 DIAGNOSIS — C50411 Malignant neoplasm of upper-outer quadrant of right female breast: Secondary | ICD-10-CM

## 2015-02-10 MED ORDER — ANASTROZOLE 1 MG PO TABS
1.0000 mg | ORAL_TABLET | Freq: Every day | ORAL | Status: DC
Start: 2015-02-10 — End: 2015-02-20

## 2015-02-13 ENCOUNTER — Encounter: Payer: Self-pay | Admitting: *Deleted

## 2015-02-13 NOTE — Progress Notes (Signed)
This RN mailed prescription and paperwork to [facility].

## 2015-02-20 ENCOUNTER — Telehealth: Payer: Self-pay

## 2015-02-20 DIAGNOSIS — C50411 Malignant neoplasm of upper-outer quadrant of right female breast: Secondary | ICD-10-CM

## 2015-02-20 MED ORDER — ANASTROZOLE 1 MG PO TABS: 1.0000 mg | ORAL_TABLET | Freq: Every day | ORAL | Status: DC

## 2015-02-20 NOTE — Telephone Encounter (Signed)
Pt is in process of changing mail order pharmacy to [facility]. She only has 1 anastrozole tablet left. [facility] while the [facility] is in process, per protocol.

## 2015-03-24 ENCOUNTER — Other Ambulatory Visit: Payer: Self-pay | Admitting: *Deleted

## 2015-03-24 DIAGNOSIS — C50411 Malignant neoplasm of upper-outer quadrant of right female breast: Secondary | ICD-10-CM

## 2015-03-27 ENCOUNTER — Telehealth: Payer: Self-pay | Admitting: Oncology

## 2015-03-27 ENCOUNTER — Other Ambulatory Visit (HOSPITAL_BASED_OUTPATIENT_CLINIC_OR_DEPARTMENT_OTHER): Payer: Medicare Other

## 2015-03-27 ENCOUNTER — Encounter: Payer: Self-pay | Admitting: Nurse Practitioner

## 2015-03-27 ENCOUNTER — Ambulatory Visit (HOSPITAL_BASED_OUTPATIENT_CLINIC_OR_DEPARTMENT_OTHER): Payer: Medicare Other | Admitting: Nurse Practitioner

## 2015-03-27 VITALS — BP 151/66 | HR 98 | Temp 98.1°F | Resp 18 | Ht 66.5 in | Wt 222.9 lb

## 2015-03-27 DIAGNOSIS — Z170 Estrogen receptor positive status [ER+]: Secondary | ICD-10-CM | POA: Diagnosis not present

## 2015-03-27 DIAGNOSIS — E2839 Other primary ovarian failure: Secondary | ICD-10-CM

## 2015-03-27 DIAGNOSIS — Z79811 Long term (current) use of aromatase inhibitors: Secondary | ICD-10-CM

## 2015-03-27 DIAGNOSIS — C50411 Malignant neoplasm of upper-outer quadrant of right female breast: Secondary | ICD-10-CM

## 2015-03-27 LAB — COMPREHENSIVE METABOLIC PANEL (CC13)
ALT: 37 U/L (ref 0–55)
ANION GAP: 11 meq/L (ref 3–11)
AST: 24 U/L (ref 5–34)
Albumin: 4 g/dL (ref 3.5–5.0)
Alkaline Phosphatase: 140 U/L (ref 40–150)
BUN: 10.6 mg/dL (ref 7.0–26.0)
CO2: 26 meq/L (ref 22–29)
Calcium: 11.2 mg/dL — ABNORMAL HIGH (ref 8.4–10.4)
Chloride: 105 mEq/L (ref 98–109)
Creatinine: 0.8 mg/dL (ref 0.6–1.1)
EGFR: 82 mL/min/{1.73_m2} — AB (ref >90)
GLUCOSE: 154 mg/dL — AB (ref 70–140)
POTASSIUM: 4.1 meq/L (ref 3.5–5.1)
SODIUM: 142 meq/L (ref 136–145)
Total Bilirubin: 0.49 mg/dL (ref 0.20–1.20)
Total Protein: 7 g/dL (ref 6.4–8.3)

## 2015-03-27 LAB — CBC WITH DIFFERENTIAL/PLATELET
BASO%: 0.9 % (ref 0.0–2.0)
Basophils Absolute: 0 10*3/uL (ref 0.0–0.1)
EOS ABS: 0.1 10*3/uL (ref 0.0–0.5)
EOS%: 2.1 % (ref 0.0–7.0)
HCT: 40.6 % (ref 34.8–46.6)
HGB: 13.7 g/dL (ref 11.6–15.9)
LYMPH%: 21.1 % (ref 14.0–49.7)
MCH: 30.8 pg (ref 25.1–34.0)
MCHC: 33.8 g/dL (ref 31.5–36.0)
MCV: 91.1 fL (ref 79.5–101.0)
MONO#: 0.4 10*3/uL (ref 0.1–0.9)
MONO%: 6.9 % (ref 0.0–14.0)
NEUT#: 3.7 10*3/uL (ref 1.5–6.5)
NEUT%: 69 % (ref 38.4–76.8)
PLATELETS: 247 10*3/uL (ref 145–400)
RBC: 4.46 10*6/uL (ref 3.70–5.45)
RDW: 13.2 % (ref 11.2–14.5)
WBC: 5.4 10*3/uL (ref 3.9–10.3)
lymph#: 1.1 10*3/uL (ref 0.9–3.3)

## 2015-03-27 NOTE — Telephone Encounter (Signed)
Gave avs & calendar for February. [facility] Bone Density 11/01 @ 12:00

## 2015-03-27 NOTE — Progress Notes (Signed)
ID: Lori Randall OB: September 26, 1950  MR#: [id number]  DJT#:[id number]  PCP: Osborne Casco, MD GYN:   SU: Fanny Skates OTHER MD: Kelton Pillar, Christene Slates, Kyra Searles  CHIEF COMPLAINT: right breast cancer  CURRENT THERAPY: anastrozole   BREAST CANCER HISTORY: From the earlier summary:  "Lori Randall" had routine screening mammography at [facility] 01/11/2013 showing a potential abnormality in the left breast. Additional views 01/13/2013 found that the calcifications noted in the left breast were likely benign. However on the right a previously noted mass was now irregular in contour. There were also some associated calcifications. A right breast ultrasound found a hypoechoic mass collar than wide measuring 1.2 cm. Biopsy of this mass the same day ([id number]) showed an invasive ductal carcinoma, grade 1, estrogen receptor 100% positive, progesterone receptor 86% positive, with an MIB-1 of 14% and no HER-2 amplification.  Bilateral breast MRIs 01/25/2013 showed a 2.1 cm lobulated enhancing mass in the posterior third of the upper outer quadrant of the right breast there were no other areas of concern in either breast and no enlarged axillary or internal mammary adenopathy.  The patient's subsequent history is as detailed below   INTERVAL HISTORY: Lori Randall returns today for follow up of her right breast cancer. She has been on anastrozole since November 2014 and is tolerating this drug well with no side effects that she is aware of. She denies hot flashes, vaginal changes, or arthralgias/myalgias. The interval history is remarkable for a right knee replacement this past January, and she is expected to have the left replaced this upcoming January. She completed physical therapy, but is no longer regularly exercising. She has gained some weight since the surgery as well.   REVIEW OF SYSTEMS: Lori Randall denies fevers, chills, nausea, vomiting, or changes in bowel or bladder habits. She  has no shortness of breath, chest pain, cough, or palpitations. She denies headaches, dizziness, or vision changes. Her mood is stable. A detailed review of systems is otherwise stable.  PAST MEDICAL HISTORY: Past Medical History  Diagnosis Date  . Hypertension   . Arthritis   . Sleep apnea   . Hx of radiation therapy 03/11/13-04/27/13  . Breast cancer 01/13/13    right breast bx=invasive ca   . Complication of anesthesia   . PONV (postoperative nausea and vomiting)     2014 after breast surgery  . Shortness of breath dyspnea     with exertion     PAST SURGICAL HISTORY: Past Surgical History  Procedure Laterality Date  . Excisional biopsy right breast    . Partial mastectomy with needle localization and axillary sentinel lymph node bx Right 02/01/2013    Procedure: PARTIAL MASTECTOMY WITH NEEDLE LOCALIZATION AND AXILLARY SENTINEL LYMPH NODE BX;  Surgeon: Adin Hector, MD;  Location: [place];  Service: General;  Laterality: Right;  needle localization at 7:30 [facility]IS nuclear medicine 30 minutes prior to surgery right breast 9:30  . Breast surgery    . Appendectomy      age 32  . Colonoscopy w/ polypectomy    . Total knee arthroplasty Right 08/31/2014    dr Mayer Camel  . Total knee arthroplasty Right 08/31/2014    Procedure: RIGHT TOTAL KNEE ARTHROPLASTY;  Surgeon: Kerin Salen, MD;  Location: [place];  Service: Orthopedics;  Laterality: Right;    FAMILY HISTORY No family history on file. The patient's father died at the age of 22 with congestive 4 Lori Randall in the setting of severe dementia. The patient's mother died  at age 28 with atypical parkinsonism. The patient has 3 brothers and 2 sisters. There is no history of breast or ovarian cancer in the family  GYNECOLOGIC HISTORY:  Menarche age 55, first live birth age 27. The patient is GX P2. She went through menopause approximately 2004. She took hormone replacement approximately 2 years. She took birth control remotely for approximately 10  years, without complications.  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 Her husband Lori Randall") M. Surveyor, minerals used to work for [employer]. He is now retired. Daughter Lori Randall is a paramedic in [employer]. Son Lori Randall is a landscaper in [place]. The patient has no grandchildren. She attends a [employer]    ADVANCED DIRECTIVES: In place.   HEALTH MAINTENANCE: Social History  Substance Use Topics  . Smoking status: Never Smoker   . Smokeless tobacco: Never Used  . Alcohol Use: No     Comment: maybe 1 glass of wine a month     Colonoscopy: 2005  PAP: November 2014  Bone density: 06/09/2013 at [facility]; normal  Lipid panel:  No Known Allergies  Current Outpatient Prescriptions  Medication Sig Dispense Refill  . anastrozole (ARIMIDEX) 1 MG tablet Take 1 tablet (1 mg total) by mouth daily. 30 tablet 0  . atorvastatin (LIPITOR) 80 MG tablet Take 80 mg by mouth daily.    . Cholecalciferol (VITAMIN D3) 2000 UNITS TABS Take 2,000 Units by mouth daily.    . fish oil-omega-3 fatty acids 1000 MG capsule Take 1 g by mouth daily.    . hydrochlorothiazide (HYDRODIURIL) 25 MG tablet Take 25 mg by mouth daily.    . ramipril (ALTACE) 5 MG capsule Take 5 mg by mouth daily.    . traMADol (ULTRAM) 50 MG tablet Take 50 mg by mouth 2 (two) times daily as needed.  0   No current facility-administered medications for this visit.    OBJECTIVE: Middle-aged white woman examined in a chair Filed Vitals:   03/27/15 1315  BP: 151/66  Randall: 98  Temp: 98.1 F (36.7 C)  Resp: 18     Body mass index is 35.44 kg/(m^2).    ECOG FS: 2  Skin: warm, dry  HEENT: sclerae anicteric, conjunctivae pink, oropharynx clear. No thrush or mucositis.  Lymph Nodes: No cervical or supraclavicular lymphadenopathy  Lungs: clear to auscultation bilaterally, no rales, wheezes, or rhonci  Heart: regular rate and rhythm  Abdomen: round, soft, non tender, positive bowel  sounds  Musculoskeletal: No focal spinal tenderness, no peripheral edema  Neuro: non focal, well oriented, positive affect  Breasts: right breast status post lumpectomy and radiation. No evidence of recurrent disease. Right axilla benign. Left breast unremarkable.  LAB RESULTS:  CMP     Component Value Date/Time   NA 139 09/22/2014 1310   NA 140 08/22/2014 1402   K 4.1 09/22/2014 1310   K 4.0 08/22/2014 1402   CL 104 08/22/2014 1402   CL 105 01/27/2013 1208   CO2 28 09/22/2014 1310   CO2 27 08/22/2014 1402   GLUCOSE 98 09/22/2014 1310   GLUCOSE 117* 08/22/2014 1402   GLUCOSE 123* 01/27/2013 1208   BUN 12.4 09/22/2014 1310   BUN 9 08/22/2014 1402   CREATININE 0.7 09/22/2014 1310   CREATININE 0.63 08/22/2014 1402   CALCIUM 11.3* 09/22/2014 1310   CALCIUM 10.9* 08/22/2014 1402   PROT 7.8 09/22/2014 1310   PROT 6.8 03/15/2014 1255   ALBUMIN 4.1 09/22/2014 1310  ALBUMIN 4.5 03/15/2014 1255   AST 21 09/22/2014 1310   AST 25 03/15/2014 1255   ALT 26 09/22/2014 1310   ALT 33 03/15/2014 1255   ALKPHOS 163* 09/22/2014 1310   ALKPHOS 119* 03/15/2014 1255   BILITOT 0.43 09/22/2014 1310   BILITOT 0.6 03/15/2014 1255   GFRNONAA >90 08/22/2014 1402   GFRAA >90 08/22/2014 1402    I No results found for: SPEP  Lab Results  Component Value Date   WBC 5.3 09/22/2014   NEUTROABS 3.1 09/22/2014   HGB 12.8 09/22/2014   HCT 39.1 09/22/2014   MCV 91.7 09/22/2014   PLT 234 09/22/2014      Chemistry      Component Value Date/Time   NA 139 09/22/2014 1310   NA 140 08/22/2014 1402   K 4.1 09/22/2014 1310   K 4.0 08/22/2014 1402   CL 104 08/22/2014 1402   CL 105 01/27/2013 1208   CO2 28 09/22/2014 1310   CO2 27 08/22/2014 1402   BUN 12.4 09/22/2014 1310   BUN 9 08/22/2014 1402   CREATININE 0.7 09/22/2014 1310   CREATININE 0.63 08/22/2014 1402      Component Value Date/Time   CALCIUM 11.3* 09/22/2014 1310   CALCIUM 10.9* 08/22/2014 1402   ALKPHOS 163* 09/22/2014 1310    ALKPHOS 119* 03/15/2014 1255   AST 21 09/22/2014 1310   AST 25 03/15/2014 1255   ALT 26 09/22/2014 1310   ALT 33 03/15/2014 1255   BILITOT 0.43 09/22/2014 1310   BILITOT 0.6 03/15/2014 1255       No results found for: LABCA2  No components found for: LABCA125  No results for input(s): INR in the last 168 hours.  Urinalysis    Component Value Date/Time   COLORURINE YELLOW 08/22/2014 1402    STUDIES: Most recent mammogram performed at [facility] in June 2016 was unremarkable per patient. Obtaining records now.   Most recent bone density scan showed a t-score of -0.3 (normal)  ASSESSMENT: 64 y.o. [place], [place] woman status post right breast biopsy 01/13/2013 for a clinical T2 N0, stage IIA invasive ductal carcinoma, grade 1, estrogen receptor 100% positive, progesterone receptor 86% positive, with an MIB-1 of 14% and no HER-2 amplification  (1) status post right lumpectomy and sentinel lymph node dissection 02/01/2013 for a pT2 pN0, stage IIA invasive ductal carcinoma, grade 1, with repeat HER-2 negative and ample margins.  (2) Oncotype DX score of 13 predicts a risk of distant recurrence of 8% within 10 years if the patient's only systemic therapy is tamoxifen for 5 years. It also predicts no significant benefit from chemotherapy  (3) adjuvant radiation completed 04/27/2013  (4) started anastrozole 06/05/2013  (a) bone density scan November 2014 was normal  PLAN: Lori Randall is doing well today. She is now 2 years out from her definitive surgery with no evidence of recurrent disease. She is tolerating the anastrozole and is expected to continue for 5 years of antiestrogen therapy in November 2019. She will be due for a repeat bone density scan this November, so I have placed the appropriate orders during today's visit.   I encouraged Lori Randall to exercise her knee, which was also advised by her orthopedic surgeon. She agrees.   Lori Randall will return in 6 months for labs and a follow up visit.  She understands and agrees with this plan. She knows the goal of treatment in her case is cure. She has been encouraged to call with any issues that might arise before her  next visit here.    Laurie Panda, NP   03/27/2015 1:48 PM

## 2015-07-24 ENCOUNTER — Other Ambulatory Visit: Payer: Self-pay | Admitting: Orthopedic Surgery

## 2015-08-02 ENCOUNTER — Other Ambulatory Visit (HOSPITAL_COMMUNITY): Payer: Self-pay | Admitting: *Deleted

## 2015-08-03 ENCOUNTER — Encounter (HOSPITAL_COMMUNITY)
Admission: RE | Admit: 2015-08-03 | Discharge: 2015-08-03 | Disposition: A | Discharge status: Home / Self Care | Payer: Medicare Other | Source: Ambulatory Visit | Attending: Orthopedic Surgery | Admitting: Orthopedic Surgery

## 2015-08-03 ENCOUNTER — Encounter (HOSPITAL_COMMUNITY): Payer: Self-pay

## 2015-08-03 DIAGNOSIS — Z01812 Encounter for preprocedural laboratory examination: Secondary | ICD-10-CM | POA: Insufficient documentation

## 2015-08-03 DIAGNOSIS — M1712 Unilateral primary osteoarthritis, left knee: Secondary | ICD-10-CM | POA: Insufficient documentation

## 2015-08-03 DIAGNOSIS — Z0183 Encounter for blood typing: Secondary | ICD-10-CM | POA: Diagnosis not present

## 2015-08-03 HISTORY — DX: Headache, unspecified: R51.9

## 2015-08-03 HISTORY — DX: Headache: R51

## 2015-08-03 LAB — CBC WITH DIFFERENTIAL/PLATELET
BASOS ABS: 0 10*3/uL (ref 0.0–0.1)
BASOS PCT: 1 %
EOS ABS: 0.1 10*3/uL (ref 0.0–0.7)
EOS PCT: 3 %
HEMATOCRIT: 41.5 % (ref 36.0–46.0)
Hemoglobin: 13.9 g/dL (ref 12.0–15.0)
Lymphocytes Relative: 26 %
Lymphs Abs: 1.3 10*3/uL (ref 0.7–4.0)
MCH: 31.2 pg (ref 26.0–34.0)
MCHC: 33.5 g/dL (ref 30.0–36.0)
MCV: 93 fL (ref 78.0–100.0)
MONO ABS: 0.3 10*3/uL (ref 0.1–1.0)
MONOS PCT: 7 %
NEUTROS ABS: 3.2 10*3/uL (ref 1.7–7.7)
Neutrophils Relative %: 63 %
PLATELETS: 245 10*3/uL (ref 150–400)
RBC: 4.46 MIL/uL (ref 3.87–5.11)
RDW: 12.5 % (ref 11.5–15.5)
WBC: 4.9 10*3/uL (ref 4.0–10.5)

## 2015-08-03 LAB — TYPE AND SCREEN
ABO/RH(D): AB NEG
Antibody Screen: NEGATIVE

## 2015-08-03 LAB — URINALYSIS, ROUTINE W REFLEX MICROSCOPIC
Bilirubin Urine: NEGATIVE
GLUCOSE, UA: NEGATIVE mg/dL
Hgb urine dipstick: NEGATIVE
Ketones, ur: NEGATIVE mg/dL
LEUKOCYTES UA: NEGATIVE
Nitrite: NEGATIVE
PROTEIN: NEGATIVE mg/dL
SPECIFIC GRAVITY, URINE: 1.007 (ref 1.005–1.030)
pH: 6.5 (ref 5.0–8.0)

## 2015-08-03 LAB — SURGICAL PCR SCREEN
MRSA, PCR: NEGATIVE
Staphylococcus aureus: NEGATIVE

## 2015-08-03 LAB — PROTIME-INR
INR: 1.02 (ref 0.00–1.49)
Prothrombin Time: 13.6 seconds (ref 11.6–15.2)

## 2015-08-03 LAB — BASIC METABOLIC PANEL
Anion gap: 9 (ref 5–15)
BUN: 9 mg/dL (ref 6–20)
CALCIUM: 10.8 mg/dL — AB (ref 8.9–10.3)
CO2: 29 mmol/L (ref 22–32)
CREATININE: 0.6 mg/dL (ref 0.44–1.00)
Chloride: 105 mmol/L (ref 101–111)
GFR calc Af Amer: >60 mL/min (ref >60)
GLUCOSE: 104 mg/dL — AB (ref 65–99)
Potassium: 3.5 mmol/L (ref 3.5–5.1)
Sodium: 143 mmol/L (ref 135–145)

## 2015-08-03 LAB — APTT: APTT: 26 s (ref 24–37)

## 2015-08-03 NOTE — Pre-Procedure Instructions (Signed)
YATZARI ADDERLY  08/03/2015      [id number] [place], [place] - [address] [place] [address] Phone: [phone] Fax: [phone]  [place] [address] [place] [address] [place] [address] Phone: [phone] Fax: [phone]    Your procedure is scheduled on 08/14/2015  Report to [facility] Admitting at 5:30 A.M.  Call this number if you have problems the morning of surgery:  [phone]   Remember:  Do not eat food or drink liquids after midnight.  On Sunday NIGHT   Take these medicines the morning of surgery with A SIP OF WATER :Arimidex   Do not wear jewelry, make-up or nail polish.   Do not wear lotions, powders, or perfumes.  You may wear deodorant.   Do not shave 48 hours prior to surgery.    Do not bring valuables to the hospital.   [facility] is not responsible for any belongings or valuables.              Stop Fish Oil & Aspirin after 1/ 11/2015  Contacts, dentures or bridgework may not be worn into surgery.  Leave your suitcase in the car.  After surgery it may be brought to your room.  For patients admitted to the hospital, discharge time will be determined by your treatment team.  Patients discharged the day of surgery will not be allowed to drive home.   Name and phone number of your driver:   With spouse  Special instructions:  Special Instructions: [place] - Preparing for Surgery  Before surgery, you can play an important role.  Because skin is not sterile, your skin needs to be as free of germs as possible.  You can reduce the number of germs on you skin by washing with CHG (chlorahexidine gluconate) soap before surgery.  CHG is an antiseptic cleaner which kills germs and bonds with the skin to continue killing germs even after washing.  Please DO NOT use if you have an allergy to CHG or antibacterial soaps.  If your skin becomes  reddened/irritated stop using the CHG and inform your nurse when you arrive at Short Stay.  Do not shave (including legs and underarms) for at least 48 hours prior to the first CHG shower.  You may shave your face.  Please follow these instructions carefully:   1.  Shower with CHG Soap the night before surgery and the  morning of Surgery.  2.  If you choose to wash your hair, wash your hair first as usual with your  normal shampoo.  3.  After you shampoo, rinse your hair and body thoroughly to remove the  Shampoo.  4.  Use CHG as you would any other liquid soap.  You can apply chg directly to the skin and wash gently with scrungie or a clean washcloth.  5.  Apply the CHG Soap to your body ONLY FROM THE NECK DOWN.    Do not use on open wounds or open sores.  Avoid contact with your eyes, ears, mouth and genitals (private parts).  Wash genitals (private parts)   with your normal soap.  6.  Wash thoroughly, paying special attention to the area where your surgery will be performed.  7.  Thoroughly rinse your body with warm water from the neck down.  8.  DO NOT shower/wash with your normal soap after using and rinsing off  the CHG Soap.  9.  Pat yourself dry with a clean towel.            10.  Wear clean pajamas.            11.  Place clean sheets on your bed the night of your first shower and do not sleep with pets.  Day of Surgery  Do not apply any lotions/deodorants the morning of surgery.  Please wear clean clothes to the hospital/surgery center.  Please read over the following fact sheets that you were given. Pain Booklet, Coughing and Deep Breathing, Blood Transfusion Information, MRSA Information and Surgical Site Infection Prevention

## 2015-08-03 NOTE — Progress Notes (Signed)
Pt. Denies all chest complaints, flu related symptoms, chest  pain, SOB. Pt. Denies ever having any advanced cardiac surveillance. She sees Lady Deutscher for PCP, also followed by Dr. Maxwell Caul for sleep apnea.

## 2015-08-05 IMAGING — CR DG CHEST 2V
2 series · 2 of 2 positions shown · non-contrast
Comparison: 02/01/2013.

CLINICAL DATA: Preop.

EXAM:
CHEST  2 VIEW

[w chest pa]
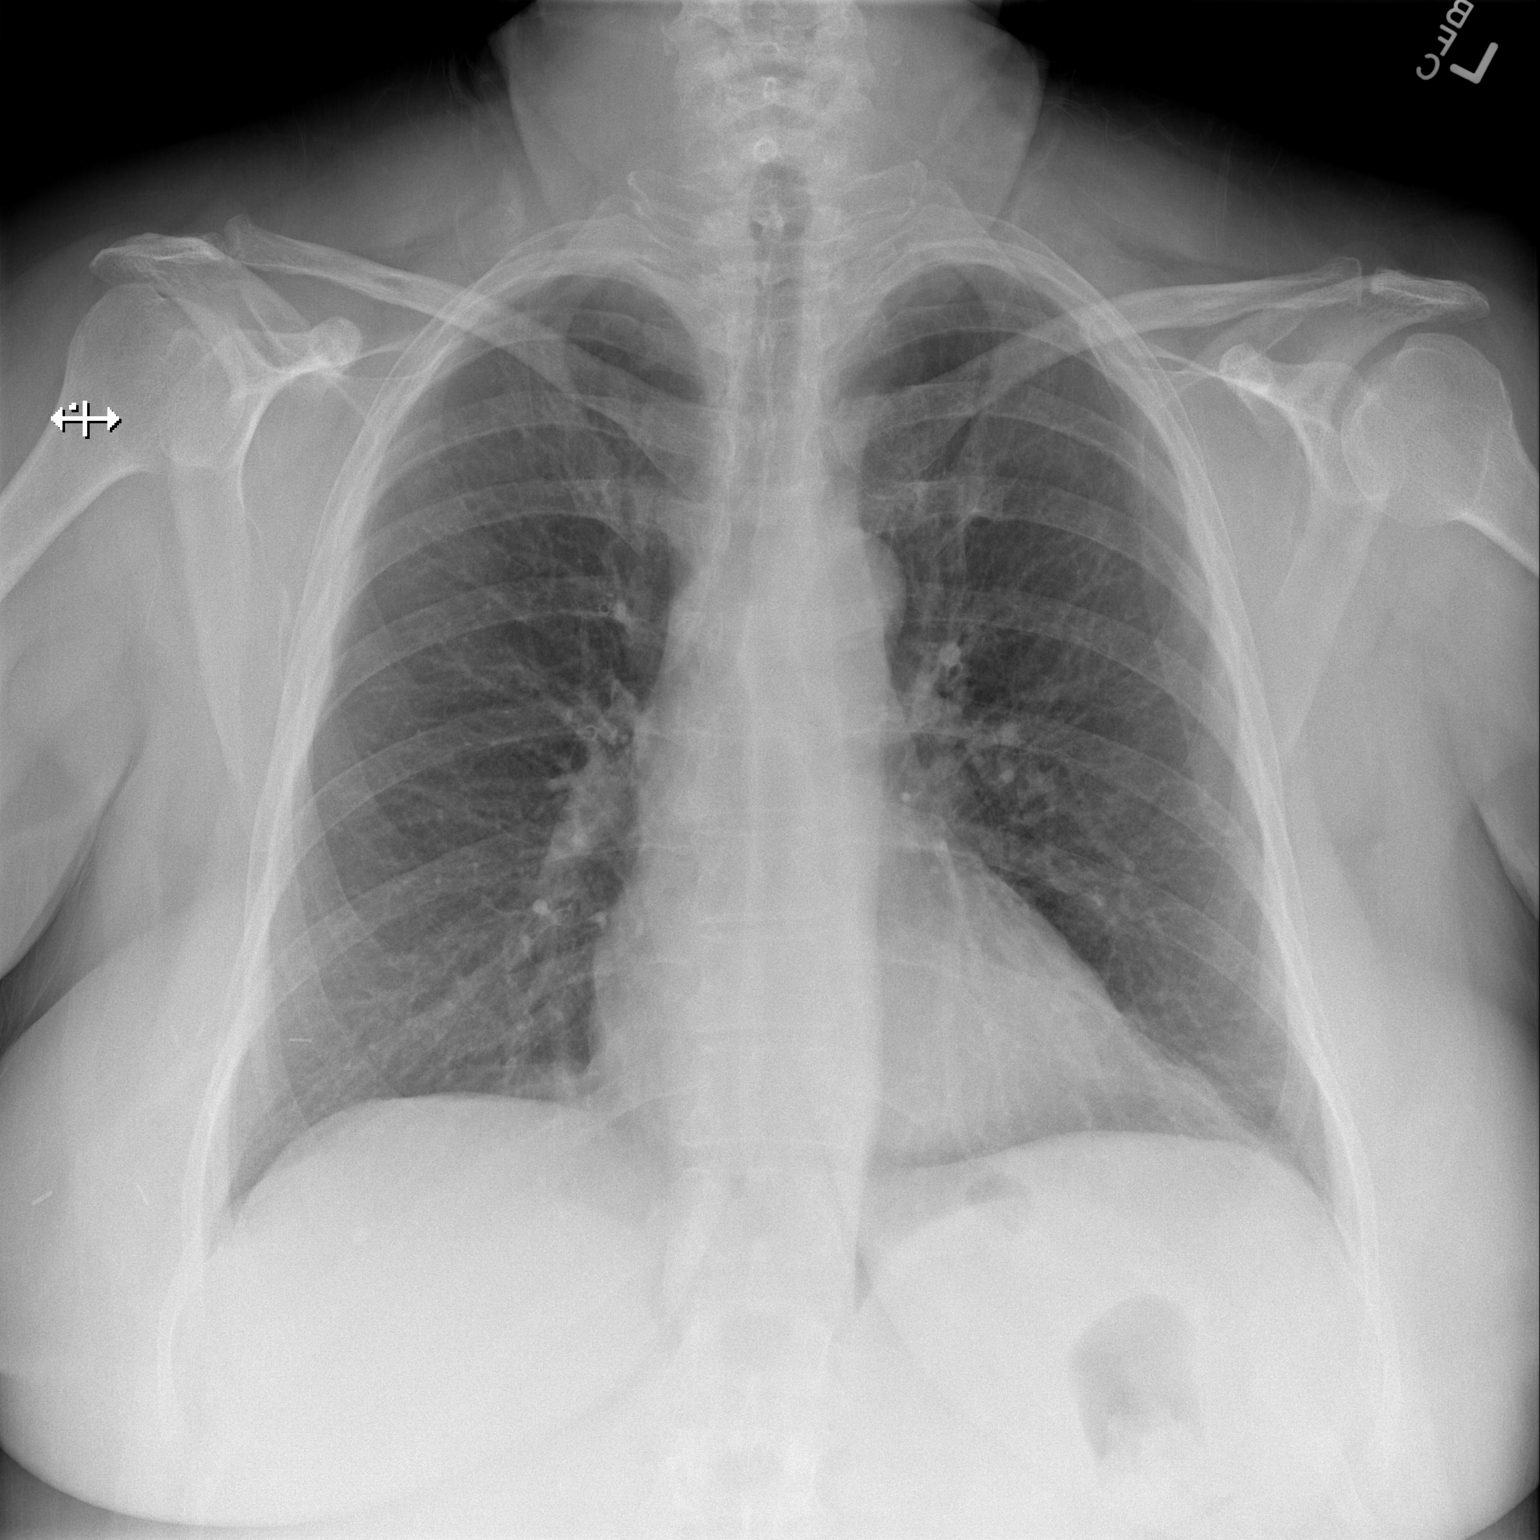

[w chest lat]
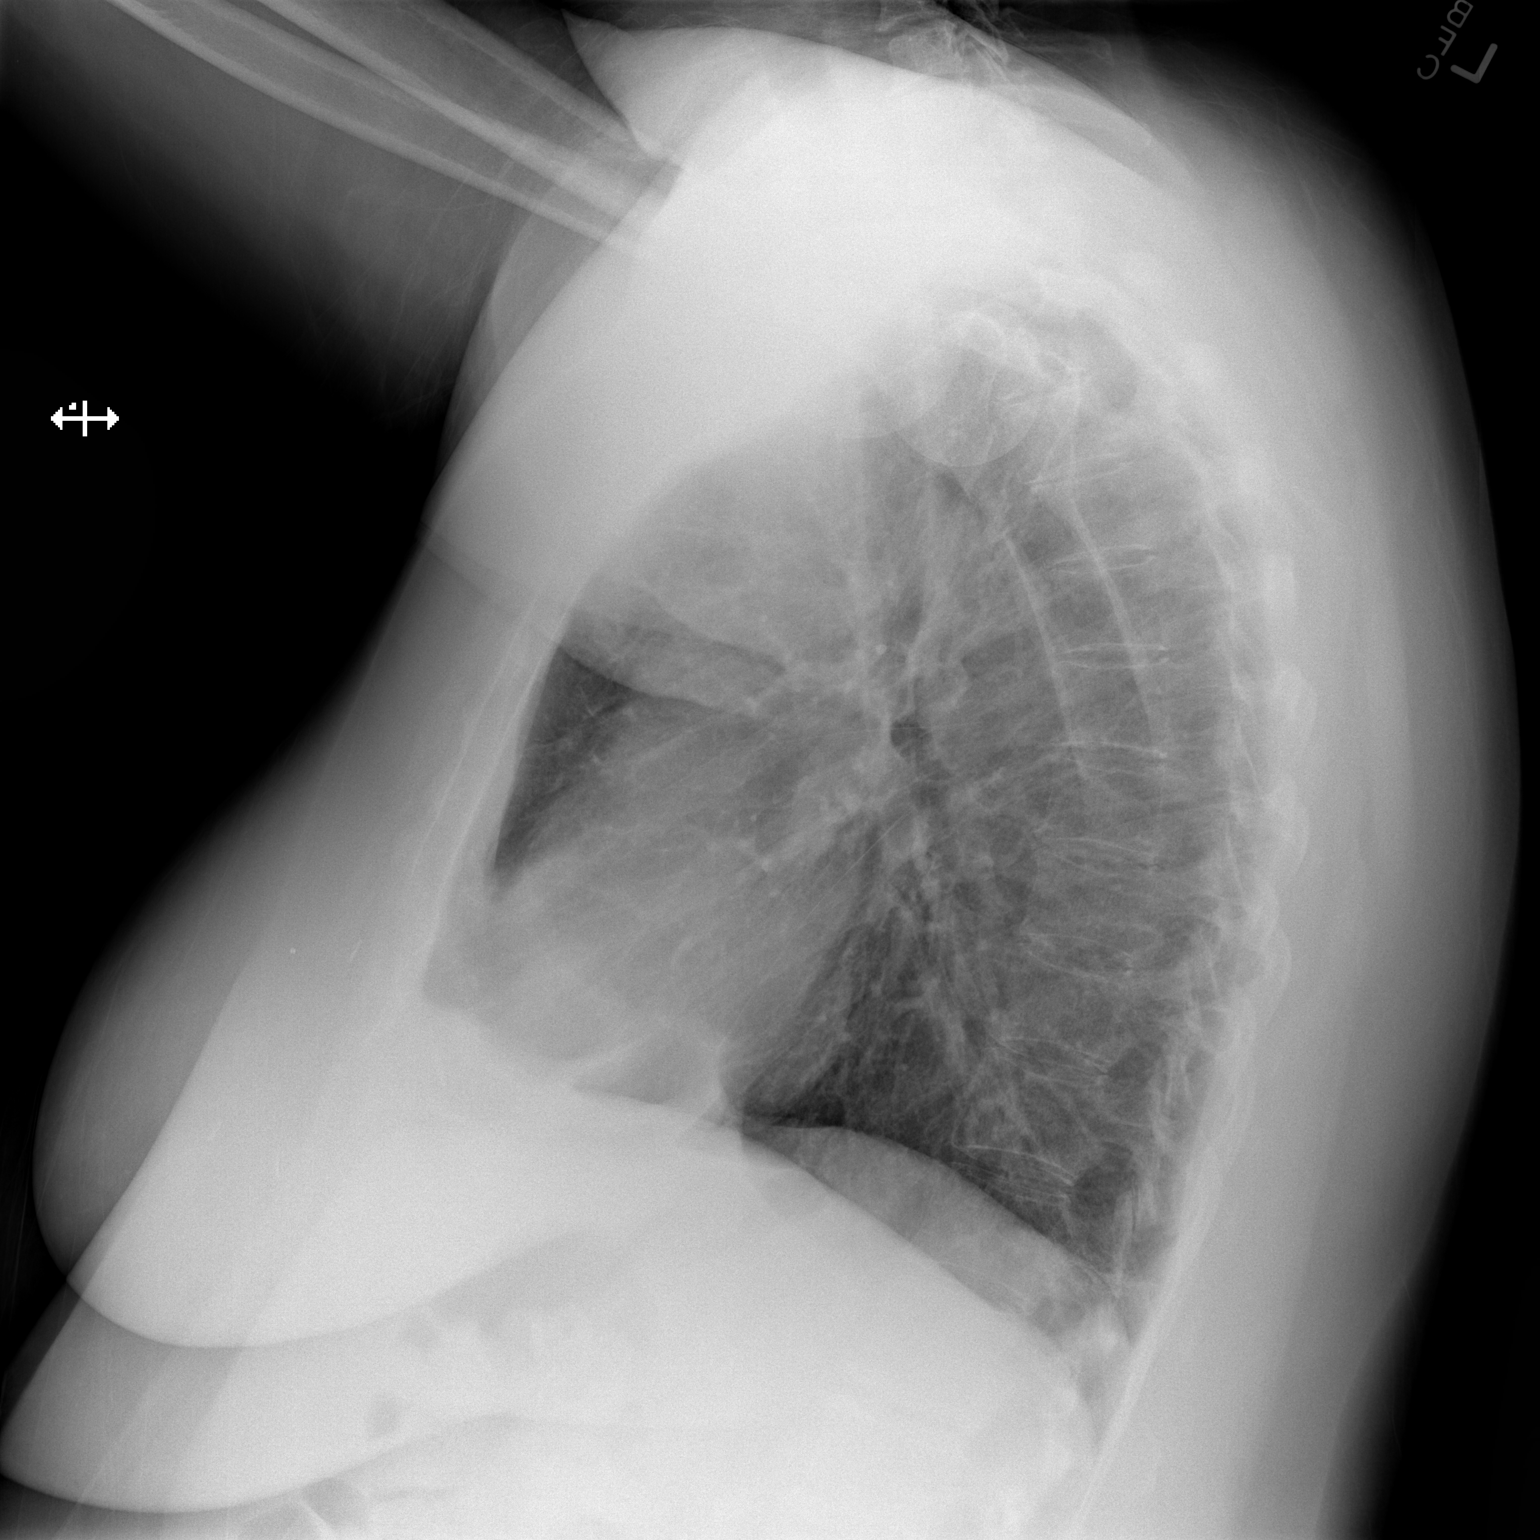

[2 of 2 positions shown; findings below may reference images not displayed]

FINDINGS: Trachea is midline. Heart size normal. Lungs are clear. No pleural
fluid. Surgical clips in the right breast.
IMPRESSION: No acute findings.

## 2015-08-11 NOTE — H&P (Signed)
TOTAL KNEE ADMISSION H&P  Patient is being admitted for left total knee arthroplasty.  Subjective:  Chief Complaint:left knee pain.  HPI: Lori Randall, 65 y.o. female, has a history of pain and functional disability in the left knee due to arthritis and has failed non-surgical conservative treatments for greater than 12 weeks to includecorticosteriod injections, flexibility and strengthening excercises, use of assistive devices, weight reduction as appropriate and activity modification.  Onset of symptoms was gradual, starting 4 years ago with gradually worsening course since that time. The patient noted no past surgery on the left knee(s).  Patient currently rates pain in the left knee(s) at 10 out of 10 with activity. Patient has night pain, worsening of pain with activity and weight bearing, pain that interferes with activities of daily living, pain with passive range of motion, crepitus and joint swelling.  Patient has evidence of joint space narrowing by imaging studies. There is no active infection.  Patient Active Problem List   Diagnosis Date Noted  . Primary osteoarthritis of right knee 08/31/2014  . Breast cancer of upper-outer quadrant of right female breast ([place]) 05/25/2013   Past Medical History  Diagnosis Date  . Hypertension   . Hx of radiation therapy 03/11/13-04/27/13  . Complication of anesthesia   . PONV (postoperative nausea and vomiting)     2014 after breast surgery  . Shortness of breath dyspnea     with exertion   . Headache     h/o of migraines   . Breast cancer ([place]) 01/13/13    right breast bx=invasive ca ,tx with surgery & radiation   . Arthritis     knees & hands   . Sleep apnea     last study- 2010, Dr. Maxwell Caul , uses CPAP every night     Past Surgical History  Procedure Laterality Date  . Excisional biopsy right breast    . Partial mastectomy with needle localization and axillary sentinel lymph node bx Right 02/01/2013    Procedure: PARTIAL  MASTECTOMY WITH NEEDLE LOCALIZATION AND AXILLARY SENTINEL LYMPH NODE BX;  Surgeon: Adin Hector, MD;  Location: [facility];  Service: General;  Laterality: Right;  needle localization at 7:30 [facility] nuclear medicine 30 minutes prior to surgery right breast 9:30  . Breast surgery    . Appendectomy      age 35  . Colonoscopy w/ polypectomy    . Total knee arthroplasty Right 08/31/2014    dr Mayer Camel  . Total knee arthroplasty Right 08/31/2014    Procedure: RIGHT TOTAL KNEE ARTHROPLASTY;  Surgeon: Kerin Salen, MD;  Location: [facility];  Service: Orthopedics;  Laterality: Right;  . Joint replacement      No prescriptions prior to admission   No Known Allergies  Social History  Substance Use Topics  . Smoking status: Never Smoker   . Smokeless tobacco: Never Used  . Alcohol Use: No     Comment: maybe 1 glass of wine a month    No family history on file.   Review of Systems  Constitutional: Negative.   HENT: Negative.   Eyes: Negative.   Respiratory: Negative.   Cardiovascular:       HTN  Gastrointestinal: Negative.   Genitourinary: Negative.   Musculoskeletal: Positive for joint pain.  Skin: Negative.   Neurological: Negative.   Endo/Heme/Allergies: Negative.   Psychiatric/Behavioral: Negative.     Objective:  Physical Exam  Constitutional: She is oriented to person, place, and time. She appears well-developed and well-nourished.  HENT:  Head: Normocephalic and atraumatic.  Eyes: Pupils are equal, round, and reactive to light.  Neck: Normal range of motion. Neck supple.  Cardiovascular: Intact distal pulses.   Respiratory: Effort normal.  Musculoskeletal: She exhibits tenderness.  Examination of her left knee revealed range of motion roughly 5 to 115.  No effusion.  Her ligaments were stable.  She had medial joint line tenderness.  She is neurovascularly intact distally.    Neurological: She is alert and oriented to person, place, and time.  Skin: Skin is warm and dry.   Psychiatric: She has a normal mood and affect. Her behavior is normal. Judgment and thought content normal.    Vital signs in last 24 hours:    Labs:   Estimated body mass index is 35.44 kg/(m^2) as calculated from the following:   Height as of 03/27/15: 5' 6.5" (1.689 m).   Weight as of 03/27/15: 101.107 kg (222 lb 14.4 oz).   Imaging Review Plain radiographs demonstrate AP standing bilateral knees and lateral right knee film.  Her hardware appears to be in good position and alignment.  No signs of loosening.  The left knee has near bone-on-bone medial compartment osteoarthritis as well as patellofemoral degenerative changes.  Assessment/Plan:  End stage arthritis, left knee   The patient history, physical examination, clinical judgment of the provider and imaging studies are consistent with end stage degenerative joint disease of the left knee(s) and total knee arthroplasty is deemed medically necessary. The treatment options including medical management, injection therapy arthroscopy and arthroplasty were discussed at length. The risks and benefits of total knee arthroplasty were presented and reviewed. The risks due to aseptic loosening, infection, stiffness, patella tracking problems, thromboembolic complications and other imponderables were discussed. The patient acknowledged the explanation, agreed to proceed with the plan and consent was signed. Patient is being admitted for inpatient treatment for surgery, pain control, PT, OT, prophylactic antibiotics, VTE prophylaxis, progressive ambulation and ADL's and discharge planning. The patient is planning to be discharged home with home health services

## 2015-08-13 ENCOUNTER — Encounter (HOSPITAL_COMMUNITY): Payer: Self-pay | Admitting: Anesthesiology

## 2015-08-13 DIAGNOSIS — M1712 Unilateral primary osteoarthritis, left knee: Secondary | ICD-10-CM | POA: Diagnosis present

## 2015-08-13 MED ORDER — DEXTROSE-NACL 5-0.45 % IV SOLN: INTRAVENOUS | Status: DC

## 2015-08-13 MED ORDER — CHLORHEXIDINE GLUCONATE 4 % EX LIQD: 60.0000 mL | Freq: Once | CUTANEOUS | Status: DC

## 2015-08-13 MED ORDER — CEFAZOLIN SODIUM-DEXTROSE 2-3 GM-% IV SOLR
2.0000 g | INTRAVENOUS | Status: AC
  Administered 2015-08-14: 2 g via INTRAVENOUS
  Filled 2015-08-13: qty 50

## 2015-08-13 NOTE — Anesthesia Preprocedure Evaluation (Addendum)
Anesthesia Evaluation  Patient identified by MRN, date of birth, ID band Patient awake    Reviewed: Allergy & Precautions, NPO status , Patient's Chart, lab work & pertinent test results  History of Anesthesia Complications (+) PONV and history of anesthetic complications  Airway Mallampati: III  TM Distance: >3 FB Neck ROM: Full    Dental no notable dental hx. (+) Teeth Intact   Pulmonary shortness of breath and with exertion, sleep apnea and Continuous Positive Airway Pressure Ventilation ,    Pulmonary exam normal breath sounds clear to auscultation       Cardiovascular hypertension, Pt. on medications Normal cardiovascular exam Rhythm:Regular Rate:Normal     Neuro/Psych  Headaches, negative psych ROS   GI/Hepatic negative GI ROS, Neg liver ROS,   Endo/Other  Obesity Hyperlipidemia  Renal/GU negative Renal ROS  negative genitourinary   Musculoskeletal  (+) Arthritis , Osteoarthritis,    Abdominal (+) + obese,   Peds  Hematology   Anesthesia Other Findings   Reproductive/Obstetrics                           Anesthesia Physical Anesthesia Plan  ASA: II  Anesthesia Plan: General and Regional   Post-op Pain Management: GA combined w/ Regional for post-op pain   Induction: Intravenous  Airway Management Planned: Oral ETT  Additional Equipment:   Intra-op Plan:   Post-operative Plan: Extubation in OR  Informed Consent: I have reviewed the patients History and Physical, chart, labs and discussed the procedure including the risks, benefits and alternatives for the proposed anesthesia with the patient or authorized representative who has indicated his/her understanding and acceptance.   Dental advisory given  Plan Discussed with: Anesthesiologist, CRNA and Surgeon  Anesthesia Plan Comments:       Anesthesia Quick Evaluation

## 2015-08-14 ENCOUNTER — Inpatient Hospital Stay (HOSPITAL_COMMUNITY): Payer: Medicare Other

## 2015-08-14 ENCOUNTER — Inpatient Hospital Stay (HOSPITAL_COMMUNITY)
Admission: RE | Admit: 2015-08-14 | Discharge: 2015-08-16 | DRG: 470 | Disposition: A | Discharge status: Home Health | Payer: Medicare Other | Source: Ambulatory Visit | Attending: Orthopedic Surgery | Admitting: Orthopedic Surgery

## 2015-08-14 ENCOUNTER — Inpatient Hospital Stay (HOSPITAL_COMMUNITY): Payer: Medicare Other | Admitting: Anesthesiology

## 2015-08-14 ENCOUNTER — Encounter (HOSPITAL_COMMUNITY)
Admission: RE | Disposition: A | Discharge status: Home Health | Payer: Self-pay | Source: Ambulatory Visit | Attending: Orthopedic Surgery

## 2015-08-14 ENCOUNTER — Encounter (HOSPITAL_COMMUNITY): Payer: Self-pay | Admitting: *Deleted

## 2015-08-14 DIAGNOSIS — Z96651 Presence of right artificial knee joint: Secondary | ICD-10-CM | POA: Diagnosis present

## 2015-08-14 DIAGNOSIS — I10 Essential (primary) hypertension: Secondary | ICD-10-CM | POA: Diagnosis present

## 2015-08-14 DIAGNOSIS — M1710 Unilateral primary osteoarthritis, unspecified knee: Secondary | ICD-10-CM | POA: Diagnosis present

## 2015-08-14 DIAGNOSIS — C50411 Malignant neoplasm of upper-outer quadrant of right female breast: Secondary | ICD-10-CM | POA: Diagnosis present

## 2015-08-14 DIAGNOSIS — Z923 Personal history of irradiation: Secondary | ICD-10-CM

## 2015-08-14 DIAGNOSIS — Z79811 Long term (current) use of aromatase inhibitors: Secondary | ICD-10-CM

## 2015-08-14 DIAGNOSIS — G4730 Sleep apnea, unspecified: Secondary | ICD-10-CM | POA: Diagnosis present

## 2015-08-14 DIAGNOSIS — M25562 Pain in left knee: Secondary | ICD-10-CM | POA: Diagnosis present

## 2015-08-14 DIAGNOSIS — M1712 Unilateral primary osteoarthritis, left knee: Principal | ICD-10-CM | POA: Diagnosis present

## 2015-08-14 DIAGNOSIS — Z96659 Presence of unspecified artificial knee joint: Secondary | ICD-10-CM

## 2015-08-14 HISTORY — PX: TOTAL KNEE ARTHROPLASTY: SHX125

## 2015-08-14 SURGERY — ARTHROPLASTY, KNEE, TOTAL
Anesthesia: Regional | Site: Knee | Laterality: Left

## 2015-08-14 MED ORDER — PHENYLEPHRINE 40 MCG/ML (10ML) SYRINGE FOR IV PUSH (FOR BLOOD PRESSURE SUPPORT)
PREFILLED_SYRINGE | INTRAVENOUS | Status: AC
  Filled 2015-08-14: qty 10

## 2015-08-14 MED ORDER — SODIUM CHLORIDE 0.9 % IV SOLN
1000.0000 mg | INTRAVENOUS | Status: AC
Start: 2015-08-14 — End: 2015-08-14
  Administered 2015-08-14: 1000 mg via INTRAVENOUS
  Filled 2015-08-14: qty 10

## 2015-08-14 MED ORDER — METOPROLOL TARTRATE 1 MG/ML IV SOLN
INTRAVENOUS | Status: AC
  Filled 2015-08-14: qty 5

## 2015-08-14 MED ORDER — ONDANSETRON HCL 4 MG/2ML IJ SOLN
INTRAMUSCULAR | Status: AC
  Filled 2015-08-14: qty 2

## 2015-08-14 MED ORDER — METHOCARBAMOL 500 MG PO TABS
ORAL_TABLET | ORAL | Status: AC
  Filled 2015-08-14: qty 1

## 2015-08-14 MED ORDER — BUPIVACAINE LIPOSOME 1.3 % IJ SUSP: 20.0000 mL | Freq: Once | INTRAMUSCULAR | Status: DC

## 2015-08-14 MED ORDER — KCL IN DEXTROSE-NACL 20-5-0.45 MEQ/L-%-% IV SOLN
INTRAVENOUS | Status: DC
  Administered 2015-08-14 – 2015-08-15 (×2): via INTRAVENOUS
  Filled 2015-08-14 (×2): qty 1000

## 2015-08-14 MED ORDER — SENNOSIDES-DOCUSATE SODIUM 8.6-50 MG PO TABS: 1.0000 | ORAL_TABLET | Freq: Every evening | ORAL | Status: DC | PRN

## 2015-08-14 MED ORDER — METOCLOPRAMIDE HCL 5 MG/ML IJ SOLN: 10.0000 mg | Freq: Once | INTRAMUSCULAR | Status: DC | PRN

## 2015-08-14 MED ORDER — ALUM & MAG HYDROXIDE-SIMETH 200-200-20 MG/5ML PO SUSP: 30.0000 mL | ORAL | Status: DC | PRN

## 2015-08-14 MED ORDER — METOPROLOL TARTRATE 1 MG/ML IV SOLN
INTRAVENOUS | Status: DC | PRN
  Administered 2015-08-14: 2 mg via INTRAVENOUS
  Administered 2015-08-14 (×3): 1 mg via INTRAVENOUS

## 2015-08-14 MED ORDER — PROPOFOL 500 MG/50ML IV EMUL
INTRAVENOUS | Status: DC | PRN
  Administered 2015-08-14: 25 ug/kg/min via INTRAVENOUS

## 2015-08-14 MED ORDER — ONDANSETRON HCL 4 MG PO TABS: 4.0000 mg | ORAL_TABLET | Freq: Four times a day (QID) | ORAL | Status: DC | PRN

## 2015-08-14 MED ORDER — LACTATED RINGERS IV SOLN
INTRAVENOUS | Status: DC | PRN
  Administered 2015-08-14 (×3): via INTRAVENOUS

## 2015-08-14 MED ORDER — GLYCOPYRROLATE 0.2 MG/ML IJ SOLN
INTRAMUSCULAR | Status: DC | PRN
  Administered 2015-08-14 (×2): 0.1 mg via INTRAVENOUS

## 2015-08-14 MED ORDER — METHOCARBAMOL 500 MG PO TABS
500.0000 mg | ORAL_TABLET | Freq: Four times a day (QID) | ORAL | Status: DC | PRN
  Administered 2015-08-14 – 2015-08-16 (×6): 500 mg via ORAL
  Filled 2015-08-14 (×5): qty 1

## 2015-08-14 MED ORDER — MENTHOL 3 MG MT LOZG: 1.0000 | LOZENGE | OROMUCOSAL | Status: DC | PRN

## 2015-08-14 MED ORDER — HYDROMORPHONE HCL 1 MG/ML IJ SOLN
0.2500 mg | INTRAMUSCULAR | Status: DC | PRN
  Administered 2015-08-14 (×4): 0.5 mg via INTRAVENOUS

## 2015-08-14 MED ORDER — LIDOCAINE HCL (CARDIAC) 20 MG/ML IV SOLN
INTRAVENOUS | Status: AC
  Filled 2015-08-14: qty 5

## 2015-08-14 MED ORDER — LIDOCAINE HCL (CARDIAC) 20 MG/ML IV SOLN
INTRAVENOUS | Status: DC | PRN
  Administered 2015-08-14: 80 mg via INTRATRACHEAL

## 2015-08-14 MED ORDER — GLYCOPYRROLATE 0.2 MG/ML IJ SOLN
INTRAMUSCULAR | Status: AC
  Filled 2015-08-14: qty 1

## 2015-08-14 MED ORDER — DIPHENHYDRAMINE HCL 12.5 MG/5ML PO ELIX: 12.5000 mg | ORAL_SOLUTION | ORAL | Status: DC | PRN

## 2015-08-14 MED ORDER — BUPIVACAINE LIPOSOME 1.3 % IJ SUSP
20.0000 mL | INTRAMUSCULAR | Status: AC
  Administered 2015-08-14: 20 mL
  Filled 2015-08-14: qty 20

## 2015-08-14 MED ORDER — FENTANYL CITRATE (PF) 250 MCG/5ML IJ SOLN
INTRAMUSCULAR | Status: AC
  Filled 2015-08-14: qty 5

## 2015-08-14 MED ORDER — METHOCARBAMOL 1000 MG/10ML IJ SOLN
500.0000 mg | Freq: Four times a day (QID) | INTRAVENOUS | Status: DC | PRN
  Filled 2015-08-14: qty 5

## 2015-08-14 MED ORDER — HYDROMORPHONE HCL 1 MG/ML IJ SOLN
0.5000 mg | INTRAMUSCULAR | Status: DC | PRN
  Administered 2015-08-15 (×3): 1 mg via INTRAVENOUS
  Filled 2015-08-14 (×3): qty 1

## 2015-08-14 MED ORDER — HYDROCHLOROTHIAZIDE 25 MG PO TABS
25.0000 mg | ORAL_TABLET | Freq: Every day | ORAL | Status: DC
  Administered 2015-08-14 – 2015-08-16 (×3): 25 mg via ORAL
  Filled 2015-08-14 (×3): qty 1

## 2015-08-14 MED ORDER — METOCLOPRAMIDE HCL 5 MG PO TABS: 5.0000 mg | ORAL_TABLET | Freq: Three times a day (TID) | ORAL | Status: DC | PRN

## 2015-08-14 MED ORDER — RAMIPRIL 5 MG PO CAPS
5.0000 mg | ORAL_CAPSULE | Freq: Every day | ORAL | Status: DC
  Administered 2015-08-14 – 2015-08-16 (×3): 5 mg via ORAL
  Filled 2015-08-14 (×4): qty 1

## 2015-08-14 MED ORDER — ACETAMINOPHEN 650 MG RE SUPP: 650.0000 mg | Freq: Four times a day (QID) | RECTAL | Status: DC | PRN

## 2015-08-14 MED ORDER — MIDAZOLAM HCL 2 MG/2ML IJ SOLN
INTRAMUSCULAR | Status: DC | PRN
  Administered 2015-08-14: 2 mg via INTRAVENOUS

## 2015-08-14 MED ORDER — DOCUSATE SODIUM 100 MG PO CAPS
100.0000 mg | ORAL_CAPSULE | Freq: Two times a day (BID) | ORAL | Status: DC
  Administered 2015-08-14 – 2015-08-16 (×4): 100 mg via ORAL
  Filled 2015-08-14 (×4): qty 1

## 2015-08-14 MED ORDER — SODIUM CHLORIDE 0.9 % IR SOLN
Status: DC | PRN
  Administered 2015-08-14: 1000 mL
  Administered 2015-08-14: 3000 mL

## 2015-08-14 MED ORDER — MEPERIDINE HCL 25 MG/ML IJ SOLN: 6.2500 mg | INTRAMUSCULAR | Status: DC | PRN

## 2015-08-14 MED ORDER — ACETAMINOPHEN 325 MG PO TABS: 650.0000 mg | ORAL_TABLET | Freq: Four times a day (QID) | ORAL | Status: DC | PRN

## 2015-08-14 MED ORDER — ONDANSETRON HCL 4 MG/2ML IJ SOLN
INTRAMUSCULAR | Status: DC | PRN
  Administered 2015-08-14: 4 mg via INTRAVENOUS

## 2015-08-14 MED ORDER — MIDAZOLAM HCL 2 MG/2ML IJ SOLN
INTRAMUSCULAR | Status: AC
  Filled 2015-08-14: qty 2

## 2015-08-14 MED ORDER — ASPIRIN EC 325 MG PO TBEC
325.0000 mg | DELAYED_RELEASE_TABLET | Freq: Every day | ORAL | Status: DC
  Administered 2015-08-15 – 2015-08-16 (×2): 325 mg via ORAL
  Filled 2015-08-14 (×2): qty 1

## 2015-08-14 MED ORDER — METHOCARBAMOL 500 MG PO TABS: 500.0000 mg | ORAL_TABLET | Freq: Two times a day (BID) | ORAL | Status: DC

## 2015-08-14 MED ORDER — ONDANSETRON HCL 4 MG/2ML IJ SOLN
4.0000 mg | Freq: Four times a day (QID) | INTRAMUSCULAR | Status: DC | PRN
  Administered 2015-08-14: 4 mg via INTRAVENOUS

## 2015-08-14 MED ORDER — FLEET ENEMA 7-19 GM/118ML RE ENEM: 1.0000 | ENEMA | Freq: Once | RECTAL | Status: DC | PRN

## 2015-08-14 MED ORDER — FENTANYL CITRATE (PF) 250 MCG/5ML IJ SOLN
INTRAMUSCULAR | Status: DC | PRN
  Administered 2015-08-14: 50 ug via INTRAVENOUS
  Administered 2015-08-14 (×2): 100 ug via INTRAVENOUS

## 2015-08-14 MED ORDER — CEFUROXIME SODIUM 1.5 G IJ SOLR
INTRAMUSCULAR | Status: AC
  Filled 2015-08-14: qty 1.5

## 2015-08-14 MED ORDER — TRANEXAMIC ACID 1000 MG/10ML IV SOLN
1000.0000 mg | Freq: Once | INTRAVENOUS | Status: DC
  Filled 2015-08-14: qty 10

## 2015-08-14 MED ORDER — OXYCODONE-ACETAMINOPHEN 5-325 MG PO TABS: 1.0000 | ORAL_TABLET | ORAL | Status: DC | PRN

## 2015-08-14 MED ORDER — DEXAMETHASONE SODIUM PHOSPHATE 4 MG/ML IJ SOLN
INTRAMUSCULAR | Status: DC | PRN
  Administered 2015-08-14: 4 mg via INTRAVENOUS

## 2015-08-14 MED ORDER — PROPOFOL 10 MG/ML IV BOLUS
INTRAVENOUS | Status: DC | PRN
  Administered 2015-08-14: 120 mg via INTRAVENOUS
  Administered 2015-08-14 (×2): 50 mg via INTRAVENOUS

## 2015-08-14 MED ORDER — OXYCODONE HCL 5 MG PO TABS
ORAL_TABLET | ORAL | Status: AC
  Filled 2015-08-14: qty 2

## 2015-08-14 MED ORDER — ASPIRIN EC 325 MG PO TBEC: 325.0000 mg | DELAYED_RELEASE_TABLET | Freq: Two times a day (BID) | ORAL | Status: DC

## 2015-08-14 MED ORDER — ATORVASTATIN CALCIUM 80 MG PO TABS
80.0000 mg | ORAL_TABLET | Freq: Every day | ORAL | Status: DC
  Administered 2015-08-14 – 2015-08-16 (×3): 80 mg via ORAL
  Filled 2015-08-14 (×3): qty 1

## 2015-08-14 MED ORDER — DEXAMETHASONE SODIUM PHOSPHATE 4 MG/ML IJ SOLN
INTRAMUSCULAR | Status: AC
  Filled 2015-08-14: qty 1

## 2015-08-14 MED ORDER — TRANEXAMIC ACID 1000 MG/10ML IV SOLN
2000.0000 mg | INTRAVENOUS | Status: AC
  Administered 2015-08-14: 2000 mg via TOPICAL
  Filled 2015-08-14: qty 20

## 2015-08-14 MED ORDER — METOCLOPRAMIDE HCL 5 MG/ML IJ SOLN: 5.0000 mg | Freq: Three times a day (TID) | INTRAMUSCULAR | Status: DC | PRN

## 2015-08-14 MED ORDER — CEFUROXIME SODIUM 1.5 G IJ SOLR
INTRAMUSCULAR | Status: DC | PRN
  Administered 2015-08-14: 1.5 g

## 2015-08-14 MED ORDER — HYDROMORPHONE HCL 1 MG/ML IJ SOLN
INTRAMUSCULAR | Status: AC
  Filled 2015-08-14: qty 1

## 2015-08-14 MED ORDER — OXYCODONE HCL 5 MG PO TABS
5.0000 mg | ORAL_TABLET | ORAL | Status: DC | PRN
  Administered 2015-08-14 – 2015-08-16 (×11): 10 mg via ORAL
  Filled 2015-08-14 (×10): qty 2

## 2015-08-14 MED ORDER — ARTIFICIAL TEARS OP OINT
TOPICAL_OINTMENT | OPHTHALMIC | Status: AC
  Filled 2015-08-14: qty 3.5

## 2015-08-14 MED ORDER — BUPIVACAINE-EPINEPHRINE (PF) 0.5% -1:200000 IJ SOLN
INTRAMUSCULAR | Status: DC | PRN
  Administered 2015-08-14: 20 mL via PERINEURAL

## 2015-08-14 MED ORDER — PHENOL 1.4 % MT LIQD: 1.0000 | OROMUCOSAL | Status: DC | PRN

## 2015-08-14 MED ORDER — PHENYLEPHRINE HCL 10 MG/ML IJ SOLN
INTRAMUSCULAR | Status: DC | PRN
  Administered 2015-08-14: 120 ug via INTRAVENOUS

## 2015-08-14 MED ORDER — SUCCINYLCHOLINE CHLORIDE 20 MG/ML IJ SOLN
INTRAMUSCULAR | Status: AC
  Filled 2015-08-14: qty 1

## 2015-08-14 MED ORDER — BISACODYL 5 MG PO TBEC: 5.0000 mg | DELAYED_RELEASE_TABLET | Freq: Every day | ORAL | Status: DC | PRN

## 2015-08-14 MED ORDER — ANASTROZOLE 1 MG PO TABS
1.0000 mg | ORAL_TABLET | Freq: Every day | ORAL | Status: DC
  Administered 2015-08-15 – 2015-08-16 (×2): 1 mg via ORAL
  Filled 2015-08-14 (×2): qty 1

## 2015-08-14 MED ORDER — BUPIVACAINE LIPOSOME 1.3 % IJ SUSP
INTRAMUSCULAR | Status: DC | PRN
  Administered 2015-08-14: 20 mL

## 2015-08-14 MED ORDER — BUPIVACAINE HCL (PF) 0.5 % IJ SOLN
INTRAMUSCULAR | Status: AC
  Filled 2015-08-14: qty 30

## 2015-08-14 SURGICAL SUPPLY — 66 items
BANDAGE ESMARK 6X9 LF (GAUZE/BANDAGES/DRESSINGS) ×1 IMPLANT
BLADE SAG 18X100X1.27 (BLADE) ×3 IMPLANT
BLADE SAW SGTL 13X75X1.27 (BLADE) ×3 IMPLANT
BLADE SURG ROTATE 9660 (MISCELLANEOUS) IMPLANT
BNDG CMPR 9X6 STRL LF SNTH (GAUZE/BANDAGES/DRESSINGS) ×1
BNDG CMPR MED 10X6 ELC LF (GAUZE/BANDAGES/DRESSINGS) ×1
BNDG ELASTIC 6X10 VLCR STRL LF (GAUZE/BANDAGES/DRESSINGS) ×3 IMPLANT
BNDG ESMARK 6X9 LF (GAUZE/BANDAGES/DRESSINGS) ×3
BOWL SMART MIX CTS (DISPOSABLE) ×3 IMPLANT
CAPT KNEE TOTAL 3 ATTUNE ×2 IMPLANT
CEMENT HV SMART SET (Cement) ×6 IMPLANT
COVER SURGICAL LIGHT HANDLE (MISCELLANEOUS) ×3 IMPLANT
CUFF TOURNIQUET SINGLE 34IN LL (TOURNIQUET CUFF) ×3 IMPLANT
CUFF TOURNIQUET SINGLE 44IN (TOURNIQUET CUFF) IMPLANT
DRAPE EXTREMITY T 121X128X90 (DRAPE) ×3 IMPLANT
DRAPE U-SHAPE 47X51 STRL (DRAPES) ×3 IMPLANT
DURAPREP 26ML APPLICATOR (WOUND CARE) ×6 IMPLANT
ELECT REM PT RETURN 9FT ADLT (ELECTROSURGICAL) ×3
ELECTRODE REM PT RTRN 9FT ADLT (ELECTROSURGICAL) ×1 IMPLANT
EVACUATOR 1/8 PVC DRAIN (DRAIN) IMPLANT
GAUZE SPONGE 4X4 12PLY STRL (GAUZE/BANDAGES/DRESSINGS) ×4 IMPLANT
GAUZE XEROFORM 1X8 LF (GAUZE/BANDAGES/DRESSINGS) ×3 IMPLANT
GLOVE BIO SURGEON STRL SZ 6.5 (GLOVE) ×2 IMPLANT
GLOVE BIO SURGEON STRL SZ7.5 (GLOVE) ×3 IMPLANT
GLOVE BIO SURGEON STRL SZ8.5 (GLOVE) ×3 IMPLANT
GLOVE BIO SURGEONS STRL SZ 6.5 (GLOVE) ×2
GLOVE BIOGEL PI IND STRL 6.5 (GLOVE) IMPLANT
GLOVE BIOGEL PI IND STRL 7.0 (GLOVE) IMPLANT
GLOVE BIOGEL PI IND STRL 8 (GLOVE) ×1 IMPLANT
GLOVE BIOGEL PI IND STRL 9 (GLOVE) ×1 IMPLANT
GLOVE BIOGEL PI INDICATOR 6.5 (GLOVE) ×4
GLOVE BIOGEL PI INDICATOR 7.0 (GLOVE) ×4
GLOVE BIOGEL PI INDICATOR 8 (GLOVE) ×2
GLOVE BIOGEL PI INDICATOR 9 (GLOVE) ×2
GLOVE SURG SS PI 7.0 STRL IVOR (GLOVE) ×2 IMPLANT
GOWN STRL REUS W/ TWL LRG LVL3 (GOWN DISPOSABLE) ×1 IMPLANT
GOWN STRL REUS W/ TWL XL LVL3 (GOWN DISPOSABLE) ×2 IMPLANT
GOWN STRL REUS W/TWL LRG LVL3 (GOWN DISPOSABLE) ×3
GOWN STRL REUS W/TWL XL LVL3 (GOWN DISPOSABLE) ×12
HANDPIECE INTERPULSE COAX TIP (DISPOSABLE) ×3
HOOD PEEL AWAY FACE SHEILD DIS (HOOD) ×6 IMPLANT
KIT BASIN OR (CUSTOM PROCEDURE TRAY) ×3 IMPLANT
KIT ROOM TURNOVER OR (KITS) ×3 IMPLANT
MANIFOLD NEPTUNE II (INSTRUMENTS) ×3 IMPLANT
NDL SPNL 18GX3.5 QUINCKE PK (NEEDLE) IMPLANT
NEEDLE SPNL 18GX3.5 QUINCKE PK (NEEDLE) IMPLANT
NS IRRIG 1000ML POUR BTL (IV SOLUTION) ×3 IMPLANT
PACK TOTAL JOINT (CUSTOM PROCEDURE TRAY) ×3 IMPLANT
PACK UNIVERSAL I (CUSTOM PROCEDURE TRAY) ×3 IMPLANT
PAD ARMBOARD 7.5X6 YLW CONV (MISCELLANEOUS) ×6 IMPLANT
PADDING CAST COTTON 6X4 STRL (CAST SUPPLIES) ×3 IMPLANT
SET HNDPC FAN SPRY TIP SCT (DISPOSABLE) ×1 IMPLANT
SUT VIC AB 0 CT1 27 (SUTURE) ×3
SUT VIC AB 0 CT1 27XBRD ANBCTR (SUTURE) ×1 IMPLANT
SUT VIC AB 1 CTX 36 (SUTURE) ×3
SUT VIC AB 1 CTX36XBRD ANBCTR (SUTURE) ×1 IMPLANT
SUT VIC AB 2-0 CT1 27 (SUTURE) ×3
SUT VIC AB 2-0 CT1 TAPERPNT 27 (SUTURE) ×1 IMPLANT
SUT VIC AB 3-0 CT1 27 (SUTURE) ×3
SUT VIC AB 3-0 CT1 TAPERPNT 27 (SUTURE) ×1 IMPLANT
SUT VIC AB 3-0 FS2 27 (SUTURE) ×3 IMPLANT
SYR 50ML LL SCALE MARK (SYRINGE) ×3 IMPLANT
TOWEL OR 17X24 6PK STRL BLUE (TOWEL DISPOSABLE) ×3 IMPLANT
TOWEL OR 17X26 10 PK STRL BLUE (TOWEL DISPOSABLE) ×3 IMPLANT
TRAY CATH 16FR W/PLASTIC CATH (SET/KITS/TRAYS/PACK) IMPLANT
WATER STERILE IRR 1000ML POUR (IV SOLUTION) ×9 IMPLANT

## 2015-08-14 NOTE — Transfer of Care (Signed)
Immediate Anesthesia Transfer of Care Note  Patient: Lori Randall  Procedure(s) Performed: Procedure(s): TOTAL KNEE ARTHROPLASTY (Left)  Patient Location: PACU  Anesthesia Type:General and Regional  Level of Consciousness: awake, alert , oriented and patient cooperative  Airway & Oxygen Therapy: Patient Spontanous Breathing and Patient connected to face mask oxygen  Post-op Assessment: Report given to RN, Post -op Vital signs reviewed and stable and Patient moving all extremities X 4  Post vital signs: Reviewed and stable  Last Vitals:  Filed Vitals:   08/14/15 0603  BP: 176/81  Pulse: 81  Temp: 36.9 C  Resp: 20    Complications: No apparent anesthesia complications

## 2015-08-14 NOTE — Progress Notes (Signed)
Orthopedic Tech Progress Note Patient Details:  Lori Randall [birth year] [id number]  CPM Left Knee CPM Left Knee: On Left Knee Flexion (Degrees): 40 Left Knee Extension (Degrees): 10 Additional Comments: Trapeze bar and foot roll   [name] Pink 08/14/2015, 11:18 AM

## 2015-08-14 NOTE — Discharge Instructions (Signed)

## 2015-08-14 NOTE — Progress Notes (Signed)
Placed patient on CPAP set at 11cm

## 2015-08-14 NOTE — Op Note (Signed)
PATIENT ID:      Lori Randall  MRN:     [id number] DOB/AGE:    1951/02/10 / 65 y.o.       OPERATIVE REPORT    DATE OF PROCEDURE:  08/14/2015       PREOPERATIVE DIAGNOSIS:   LEFT KNEE OSTEOARTHRITIS      Estimated body mass index is 35.3 kg/(m^2) as calculated from the following:   Height as of 08/03/15: 5' 6.5" (1.689 m).   Weight as of this encounter: 100.699 kg (222 lb).                                                        POSTOPERATIVE DIAGNOSIS:   Left Knee Osteoarthritis.                                                                      PROCEDURE:  Procedure(s): TOTAL KNEE ARTHROPLASTY Using DepuyAttune RP implants #5L Femur, #5Tibia, 5 mm Attune RP bearing, 38 Patella     SURGEON: Antoni Stefan J    ASSISTANT:   Eric K. [employer]   (Present and scrubbed throughout the case, critical for assistance with exposure, retraction, instrumentation, and closure.)         ANESTHESIA: GET, 20cc Exparel, 20cc 0.5% Marcaine  EBL: 300  FLUID REPLACEMENT: 1600 crystalloid  TOURNIQUET TIME: 39min  Drains: None  Tranexamic Acid: 1gm iv 2gm topical   COMPLICATIONS:  None         INDICATIONS FOR PROCEDURE: The patient has  LEFT KNEE OSTEOARTHRITIS, Var deformities, XR shows bone on bone arthritis, lateral subluxation of tibia. Patient has failed all conservative measures including anti-inflammatory medicines, narcotics, attempts at  exercise and weight loss, cortisone injections and viscosupplementation.  Risks and benefits of surgery have been discussed, questions answered.   DESCRIPTION OF PROCEDURE: The patient identified by armband, received  IV antibiotics, in the holding area at [facility]. Patient taken to the operating room, appropriate anesthetic  monitors were attached, and GET anesthesia was  induced. Tourniquet  applied high to the operative thigh. Lateral post and foot positioner  applied to the table, the lower extremity was then prepped and draped  in usual  sterile fashion from the toes to the tourniquet. Time-out procedure was performed. We began the operation, with the knee flexed 120 degrees, by making the anterior midline incision starting at handbreadth above the patella going over the patella 1 cm medial to and 4 cm distal to the tibial tubercle. Small bleeders in the skin and the  subcutaneous tissue identified and cauterized. Transverse retinaculum was incised and reflected medially and a medial parapatellar arthrotomy was accomplished. the patella was everted and theprepatellar fat pad resected. The superficial medial collateral  ligament was then elevated from anterior to posterior along the proximal  flare of the tibia and anterior half of the menisci resected. The knee was hyperflexed exposing bone on bone arthritis. Peripheral and notch osteophytes as well as the cruciate ligaments were then resected. We continued to  work our way around posteriorly along the proximal tibia,  and externally  rotated the tibia subluxing it out from underneath the femur. A McHale  retractor was placed through the notch and a lateral Hohmann retractor  placed, and we then drilled through the proximal tibia in line with the  axis of the tibia followed by an intramedullary guide rod and 2-degree  posterior slope cutting guide. The tibial cutting guide, 3 degree posterior sloped, was pinned into place allowing resection of 4 mm of bone medially and 8 mm of bone laterally. Satisfied with the tibial resection, we then  entered the distal femur 2 mm anterior to the PCL origin with the  intramedullary guide rod and applied the distal femoral cutting guide  set at 9 mm, with 5 degrees of valgus. This was pinned along the  epicondylar axis. At this point, the distal femoral cut was accomplished without difficulty. We then sized for a #5L femoral component and pinned the guide in 3 degrees of external rotation. The chamfer cutting guide was pinned into place. The anterior,  posterior, and chamfer cuts were accomplished without difficulty followed by  the Attune RP box cutting guide and the box cut. We also removed posterior osteophytes from the posterior femoral condyles. At this  time, the knee was brought into full extension. We checked our  extension and flexion gaps and found them symmetric for a 5 mm bearing. Distracting in extension with a lamina spreader, the posterior horns of the menisci were removed, and Exparel, diluted to 60 cc, with 20cc NS, and 20cc 0.5% Marcaine,was injected into the capsule and synovium of the knee. The posterior patella cut was accomplished with the 9.5 mm Attune cutting guide, sized for a 86mm dome, and the fixation pegs drilled.The knee  was then once again hyperflexed exposing the proximal tibia. We sized for a # 5 tibial base plate, applied the smokestack and the conical reamer followed by the the Delta fin keel punch. We then hammered into place the Attune RP trial femoral component, drilled the lugs, inserted a  5 mm trial bearing, trial patellar button, and took the knee through range of motion from 0-130 degrees. No thumb pressure was required for patellar Tracking. At this point, the limb was wrapped with an Esmarch bandage and the tourniquet inflated to 350 mmHg. All trial components were removed, mating surfaces irrigated with pulse lavage, and dried with suction and sponges. A double batch of DePuy HV cement with 1500 mg of Zinacef was mixed and applied to all bony metallic mating surfaces except for the posterior condyles of the femur itself. In order, we  hammered into place the tibial tray and removed excess cement, the femoral component and removed excess cement. The final Attune RP bearing  was inserted, and the knee brought to full extension with compression.  The patellar button was clamped into place, and excess cement  removed. While the cement cured the wound was irrigated out with normal saline solution pulse lavage.  Ligament stability and patellar tracking were checked and found to be excellent. The parapatellar arthrotomy was closed with  running #1 Vicryl suture. The subcutaneous tissue with 0 and 2-0 undyed  Vicryl suture, and the skin with running 3-0 SQ vicryl. A dressing of Xeroform,  4 x 4, dressing sponges, Webril, and Ace wrap applied. The patient  awakened, and taken to recovery room without difficulty.   Christiann Hagerty J 08/14/2015, 9:05 AM

## 2015-08-14 NOTE — Anesthesia Postprocedure Evaluation (Signed)
Anesthesia Post Note  Patient: Lori Randall  Procedure(s) Performed: Procedure(s) (LRB): TOTAL KNEE ARTHROPLASTY (Left)  Patient location during evaluation: PACU Anesthesia Type: General Level of consciousness: awake and alert and oriented Pain management: pain level controlled Vital Signs Assessment: post-procedure vital signs reviewed and stable Respiratory status: spontaneous breathing, nonlabored ventilation, respiratory function stable and patient connected to nasal cannula oxygen Cardiovascular status: blood pressure returned to baseline and stable Postop Assessment: no signs of nausea or vomiting Anesthetic complications: no    Last Vitals:  Filed Vitals:   08/14/15 1015 08/14/15 1030  BP: 120/85 126/70  Pulse: 81 81  Temp:    Resp: 17 18    Last Pain:  Filed Vitals:   08/14/15 1043  PainSc: 7                  Axel Frisk A.

## 2015-08-14 NOTE — Progress Notes (Signed)
Utilization review completed.  

## 2015-08-14 NOTE — Anesthesia Procedure Notes (Addendum)
Anesthesia Regional Block:  Adductor canal block  Pre-Anesthetic Checklist: ,, timeout performed, Correct Patient, Correct Site, Correct Laterality, Correct Procedure, Correct Position, site marked, Risks and benefits discussed,  Surgical consent,  Pre-op evaluation,  At surgeon's request and post-op pain management  Laterality: Left  Prep: Dura Prep       Needles:  Injection technique: Single-shot  Needle Type: Echogenic Needle     Needle Length: 9cm 9 cm Needle Gauge: 21 and 21 G    Additional Needles:  Procedures: ultrasound guided (picture in chart) Adductor canal block Narrative:  Start time: 08/14/2015 7:20 AM End time: 08/14/2015 7:30 AM Injection made incrementally with aspirations every 5 mL.  Performed by: Personally  Anesthesiologist: Josephine Igo  Additional Notes: Patient tolerated procedure well. Incremental 31ml injections.   Procedure Name: Intubation Date/Time: 08/14/2015 7:50 AM Performed by: Collier Bullock Pre-anesthesia Checklist: Patient identified, Emergency Drugs available, Suction available and Patient being monitored Patient Re-evaluated:Patient Re-evaluated prior to inductionOxygen Delivery Method: Circle system utilized Preoxygenation: Pre-oxygenation with 100% oxygen Intubation Type: IV induction Ventilation: Mask ventilation without difficulty Laryngoscope Size: Mac and 4 Grade View: Grade II Tube type: Oral Tube size: 7.0 mm Number of attempts: 1 Airway Equipment and Method: Stylet Secured at: 22 cm Tube secured with: Tape Dental Injury: Teeth and Oropharynx as per pre-operative assessment

## 2015-08-14 NOTE — Interval H&P Note (Signed)
History and Physical Interval Note:  08/14/2015 7:16 AM  Lori Randall  has presented today for surgery, with the diagnosis of LEFT KNEE OSTEOARTHRITIS  The various methods of treatment have been discussed with the patient and family. After consideration of risks, benefits and other options for treatment, the patient has consented to  Procedure(s): TOTAL KNEE ARTHROPLASTY (Left) as a surgical intervention .  The patient's history has been reviewed, patient examined, no change in status, stable for surgery.  I have reviewed the patient's chart and labs.  Questions were answered to the patient's satisfaction.     Kerin Salen

## 2015-08-15 ENCOUNTER — Encounter (HOSPITAL_COMMUNITY): Payer: Self-pay | Admitting: Orthopedic Surgery

## 2015-08-15 LAB — CBC
HEMATOCRIT: 32.6 % — AB (ref 36.0–46.0)
HEMOGLOBIN: 10.7 g/dL — AB (ref 12.0–15.0)
MCH: 31.1 pg (ref 26.0–34.0)
MCHC: 32.8 g/dL (ref 30.0–36.0)
MCV: 94.8 fL (ref 78.0–100.0)
Platelets: 192 10*3/uL (ref 150–400)
RBC: 3.44 MIL/uL — ABNORMAL LOW (ref 3.87–5.11)
RDW: 12.8 % (ref 11.5–15.5)
WBC: 6.4 10*3/uL (ref 4.0–10.5)

## 2015-08-15 LAB — BASIC METABOLIC PANEL
ANION GAP: 9 (ref 5–15)
BUN: 10 mg/dL (ref 6–20)
CALCIUM: 9.7 mg/dL (ref 8.9–10.3)
CHLORIDE: 101 mmol/L (ref 101–111)
CO2: 28 mmol/L (ref 22–32)
Creatinine, Ser: 0.67 mg/dL (ref 0.44–1.00)
GFR calc non Af Amer: >60 mL/min (ref >60)
GLUCOSE: 125 mg/dL — AB (ref 65–99)
Potassium: 4.1 mmol/L (ref 3.5–5.1)
Sodium: 138 mmol/L (ref 135–145)

## 2015-08-15 NOTE — Progress Notes (Signed)
Physical Therapy Treatment Patient Details Name: Lori Randall MRN: [id number] DOB: Jul 08, 1951 Today's Date: 08/15/2015    History of Present Illness Pt. is a 65 y.o. female admitted on 08/14/15 for a left TKA due to OA. Pt. has PMH of R TKA, HTN, hx of radiation therapy due to breast cancer.    PT Comments    Pt is limited this PM by increased pain and soreness, but was still able to ambulate into the hallway and progress her LE TE.  PT plans to practice stairs in AM in anticipation of possible d/c home tomorrow.    Follow Up Recommendations  Home health PT     Equipment Recommendations  None recommended by PT    Recommendations for Other Services   NA     Precautions / Restrictions Precautions Precautions: Knee Precaution Booklet Issued: Yes (comment) Precaution Comments: handout given this AM, reviewed more exercises and knee precaution this PM.   Restrictions LLE Weight Bearing: Weight bearing as tolerated    Mobility  Bed Mobility Overal bed mobility: Needs Assistance Bed Mobility: Supine to Sit;Sit to Supine     Supine to sit: Min assist;HOB elevated Sit to supine: Min assist   General bed mobility comments: Min assist to help progress left leg over EOB and to support leg when going back to supine.  Pt with heavy reliance on bed rails for leverage during transitions  Transfers Overall transfer level: Needs assistance Equipment used: Rolling walker (2 wheeled) Transfers: Sit to/from Stand Sit to Stand: Min assist         General transfer comment: Min assist with multiple attempts to get to standing with heavy reliance on hands for powering up to stand.  Pt did best with momentum "one, two, three" this PM .  Increased pain is limiting her ability to get to standing with less assistance.   Ambulation/Gait Ambulation/Gait assistance: Min assist Ambulation Distance (Feet): 55 Feet Assistive device: 4-wheeled walker;Rolling walker (2 wheeled) Gait  Pattern/deviations: Step-to pattern;Antalgic Gait velocity: decreased Gait velocity interpretation: Below normal speed for age/gender General Gait Details: Pt with moderately antalgic gait pattern, step to with significant off weighting of left leg with arms.  Pt reported arm fatigue.           Balance Overall balance assessment: Needs assistance Sitting-balance support: Feet supported;No upper extremity supported Sitting balance-Leahy Scale: Good     Standing balance support: Bilateral upper extremity supported Standing balance-Leahy Scale: Poor                      Cognition Arousal/Alertness: Awake/alert Behavior During Therapy: WFL for tasks assessed/performed Overall Cognitive Status: Within Functional Limits for tasks assessed                      Exercises Total Joint Exercises Short Arc QuadSinclair Ship;Left;10 reps Hip ABduction/ADduction: AAROM;Left;10 reps Straight Leg Raises: AAROM;Left;10 reps        Pertinent Vitals/Pain Pain Assessment: 0-10 Pain Score: 8  Pain Location: left knee Pain Descriptors / Indicators: Aching;Burning Pain Intervention(s): Limited activity within patient's tolerance;Monitored during session;Repositioned;Patient requesting pain meds-RN notified;Ice applied           PT Goals (current goals can now be found in the care plan section) Acute Rehab PT Goals Patient Stated Goal: to go home Progress towards PT goals: Progressing toward goals    Frequency  7X/week    PT Plan Current plan remains appropriate  End of Session Equipment Utilized During Treatment: Gait belt Activity Tolerance: Patient limited by pain;Patient limited by fatigue Patient left: in bed;in CPM;with call bell/phone within reach     Time: 1700-1743 PT Time Calculation (min) (ACUTE ONLY): 43 min  Charges:  $Gait Training: 8-22 mins $Therapeutic Exercise: 8-22 mins $Therapeutic Activity: 8-22 mins           Raji Glinski B. [place],  [place], DPT [phone]   08/15/2015, 6:15 PM

## 2015-08-15 NOTE — Progress Notes (Signed)
Occupational Therapy Evaluation Patient Details Name: Lori Randall MRN: [id number] DOB: Jul 10, 1951 Today's Date: 08/15/2015    History of Present Illness Pt. is a 65 y.o. female admitted on 08/14/15 for a left TKA due to OA. Pt. has PMH of R TKA, HTN, hx of radiation therapy due to breast cancer.   Clinical Impression   PTA, pt was independent with all ADLs and used RW for mobility. Pt currently presents with acute L knee pain and required min-min guard assist with functional mobility and ADLs. Pt plans to d/c home with 24/7 assistance from husband. Pt will benefit from continued acute OT to increase independence and safety with ADLs and mobility. No OT follow up or DME recommendations at this time.     Follow Up Recommendations  No OT follow up;Supervision - Intermittent    Equipment Recommendations  None recommended by OT    Recommendations for Other Services       Precautions / Restrictions Precautions Precautions: Knee Restrictions Weight Bearing Restrictions: Yes LLE Weight Bearing: Weight bearing as tolerated      Mobility Bed Mobility Overal bed mobility: Needs Assistance Bed Mobility: Sit to Supine     Supine to sit: Min assist Sit to supine: Min assist   General bed mobility comments: HOB flat, use of bedrails. Min assist to progress LLE onto bed and to reposition in bed.  Transfers Overall transfer level: Needs assistance Equipment used: Rolling walker (2 wheeled) Transfers: Sit to/from Stand Sit to Stand: Min guard         General transfer comment: Min guard assist for safety - slight unsteadiness upon standing but no physical assist required    Balance Overall balance assessment: Needs assistance Sitting-balance support: No upper extremity supported;Feet supported Sitting balance-Leahy Scale: Good Sitting balance - Comments: Pt is able to static sit EOB with supervision, but Pt is holding on to EOB with both hands with her feet on the floor  while maintaining her balance.    Standing balance support: Bilateral upper extremity supported;During functional activity Standing balance-Leahy Scale: Poor Standing balance comment: Required UE support on sink counter to complete grooming tasks at sink. Heavy reliance on RW for UE support.                            ADL Overall ADL's : Needs assistance/impaired     Grooming: Wash/dry hands;Min guard;Standing           Upper Body Dressing : Min guard;Standing   Lower Body Dressing: Moderate assistance;Sit to/from stand   Toilet Transfer: Min guard;Ambulation;BSC;RW;Cueing for safety Toilet Transfer Details (indicate cue type and reason): Cues to feel BSC on back of legs and reach back with one hand before sitting Toileting- Clothing Manipulation and Hygiene: Min guard;Sit to/from stand       Functional mobility during ADLs: Min guard;Rolling walker General ADL Comments: Min guard for safety and balance due to increased pain. Pt had other knee replaced last year and is familiar with DME and precautions.      Vision Vision Assessment?: No apparent visual deficits   Perception     Praxis      Pertinent Vitals/Pain Pain Assessment: 0-10 Pain Score: 6  Pain Location: L knee Pain Descriptors / Indicators: Aching;Sore;Burning Pain Intervention(s): Limited activity within patient's tolerance;Monitored during session;Repositioned;Ice applied     Hand Dominance Right   Extremity/Trunk Assessment Upper Extremity Assessment Upper Extremity Assessment: Overall WFL for tasks assessed  Lower Extremity Assessment Lower Extremity Assessment: LLE deficits/detail LLE Deficits / Details: decreased ROM and strength as expected post op TKA   Cervical / Trunk Assessment Cervical / Trunk Assessment: Normal   Communication Communication Communication: No difficulties   Cognition Arousal/Alertness: Awake/alert Behavior During Therapy: WFL for tasks  assessed/performed Overall Cognitive Status: Within Functional Limits for tasks assessed                     General Comments       Exercises Exercises: Total Joint     Shoulder Instructions      Home Living Family/patient expects to be discharged to:: Private residence Living Arrangements: Spouse/significant other Available Help at Discharge: Family;Available 24 hours/day Type of Home: House Home Access: Stairs to enter [employer] of Steps: 3 Entrance Stairs-Rails: Left Home Layout: One level     Bathroom Shower/Tub: Walk-in shower;Door   [employer] Toilet: Handicapped height Bathroom Accessibility: Yes How Accessible: Accessible via walker Home Equipment: [place] - 2 wheels;Cane - single point;Bedside commode;Shower seat;Hand held shower head;Grab bars - tub/shower   Additional Comments: Pt report she already has a RW, shower chair, and portable toilet seat raiser chair.       Prior Functioning/Environment Level of Independence: Independent with assistive device(s)        Comments: RW for mobility    OT Diagnosis: Acute pain   OT Problem List: Decreased strength;Impaired balance (sitting and/or standing);Decreased range of motion;Decreased activity tolerance;Decreased coordination;Decreased knowledge of use of DME or AE;Decreased knowledge of precautions;Increased edema;Obesity   OT Treatment/Interventions: Self-care/ADL training;Therapeutic exercise;Energy conservation;DME and/or AE instruction;Therapeutic activities;Balance training;Patient/family education    OT Goals(Current goals can be found in the care plan section) Acute Rehab OT Goals Patient Stated Goal: to go home OT Goal Formulation: With patient Time For Goal Achievement: 08/29/15 Potential to Achieve Goals: Good ADL Goals Pt Will Perform Lower Body Bathing: with supervision;sit to/from stand Pt Will Perform Lower Body Dressing: with supervision;sit to/from stand Pt Will Transfer  to Toilet: with supervision;ambulating;bedside commode (BSC over toilet) Pt Will Perform Toileting - Clothing Manipulation and hygiene: with supervision;sit to/from stand Pt Will Perform Tub/Shower Transfer: Shower transfer;with supervision;ambulating;shower seat;rolling walker;grab bars  OT Frequency: Min 2X/week   Barriers to D/C:            Co-evaluation              End of Session CPM Left Knee CPM Left Knee: Off Left Knee Flexion (Degrees): 64 Left Knee Extension (Degrees): 10  Activity Tolerance:   Patient left:     Time: [id number] OT Time Calculation (min): 21 min Charges:  OT General Charges $OT Visit: 1 Procedure OT Evaluation $OT Eval Moderate Complexity: 1 Procedure G-Codes:    Redmond Baseman, OTR/L [phone] 08/15/2015, 12:30 PM

## 2015-08-15 NOTE — Progress Notes (Signed)
Patient ID: Lori Randall, female   DOB: [birth year], 65 y.o.   MRN: [id number] PATIENT ID: Lori Randall  MRN: [id number]  DOB/AGE:  May 28, 1951 / 65 y.o.  1 Day Post-Op Procedure(s) (LRB): TOTAL KNEE ARTHROPLASTY (Left)    PROGRESS NOTE Subjective: Patient is alert, oriented, no Nausea, no Vomiting, yes passing gas. Taking PO well. Denies SOB, Chest or Calf Pain. Using Incentive Spirometer, PAS in place. Ambulate WBAT, CPM 0-40 Patient reports pain as 6/10 .    Objective: Vital signs in last 24 hours: Filed Vitals:   08/14/15 2055 08/14/15 2302 08/15/15 0101 08/15/15 0643  BP: 137/59  116/50 134/73  Pulse: 97 90 86 104  Temp: 98.6 F (37 C)  98.2 F (36.8 C) 98.3 F (36.8 C)  TempSrc: Oral  Axillary Oral  Resp: 17 18 18 18   Weight:      SpO2: 98% 97% 98% 96%      Intake/Output from previous day: I/O last 3 completed shifts: In: [id number] [P.O.:720; I.V.:3125] Out: 50 [Blood:50]   Intake/Output this shift:     LABORATORY DATA:  Recent Labs  08/15/15 0410  WBC 6.4  HGB 10.7*  HCT 32.6*  PLT 192  NA 138  K 4.1  CL 101  CO2 28  BUN 10  CREATININE 0.67  GLUCOSE 125*  CALCIUM 9.7    Examination: Neurologically intact ABD soft Neurovascular intact Sensation intact distally Intact pulses distally Dorsiflexion/Plantar flexion intact Incision: no drainage No cellulitis present Compartment soft} X-ray: AP and lateral of the left femur show well-placed well fixed prosthesis. Mild cortical irregularity at the metaphyseal flare medially consistent with IM rod placement Assessment:   1 Day Post-Op Procedure(s) (LRB): TOTAL KNEE ARTHROPLASTY (Left) ADDITIONAL DIAGNOSIS: Expected Acute Blood Loss Anemia,   Plan: PT/OT WBAT, CPM 5/hrs day until ROM 0-90 degrees, then D/C CPM DVT Prophylaxis:  SCDx72hrs, ASA 325 mg BID x 2 weeks DISCHARGE PLAN: Home DISCHARGE NEEDS: HHPT, CPM, Walker and 3-in-1 comode seat     Laressa Bolinger J 08/15/2015, 7:24 AM

## 2015-08-15 NOTE — Care Management Note (Signed)
Case Management Note  Patient Details  Name: Lori Randall MRN: [id number] Date of Birth: [birth year]  Subjective/Objective:       S/p left total knee arthroplasty              Action/Plan: Set up with [facility] for home health PT by MD office.Spoke with patient, no change in discharge plan. Patient stated that she has a rolling walker and 3N1 at home. Spoke with Ruby Cola from [employer], they will deliver CPM to patient's home. Patient stated that her husband will be able to assist her after discharge.   Expected Discharge Date:                  Expected Discharge Plan:  [place]  In-House Referral:  NA  Discharge planning Services  CM Consult  Post Acute Care Choice:  Durable Medical Equipment, Home Health Choice offered to:  Patient  DME Arranged:  CPM DME Agency:  [employer]  HH Arranged:  PT [place] Agency:  [facility]  Status of Service:  Completed, signed off  Medicare Important Message Given:    Date Medicare IM Given:    Medicare IM give by:    Date Additional Medicare IM Given:    Additional Medicare Important Message give by:     If discussed at [place] of Stay Meetings, dates discussed:    Additional Comments:  Nila Nephew, RN 08/15/2015, 3:19 PM

## 2015-08-15 NOTE — Evaluation (Signed)
Physical Therapy Evaluation Patient Details Name: Lori Randall MRN: [id number] DOB: 1950/12/02 Today's Date: 08/15/2015   History of Present Illness  Pt. is a 65 y.o. female admitted on 08/14/15 for a left TKA due to OA. Pt. has PMH of R TKA, HTN, hx of radiation therapy due to breast cancer.  Clinical Impression  Pt ambulated 75 feet with RW and reported that as she continued moving it became easier to walk. Pt reports she wants to discharge home where she has the help of her husband. Pt was agreeable and completed TKA exercise program with verbal and tactile cueing.     Follow Up Recommendations Home health PT    Equipment Recommendations  None recommended by PT       Precautions / Restrictions Precautions Precautions: Knee Restrictions Weight Bearing Restrictions: Yes LLE Weight Bearing: Weight bearing as tolerated      Mobility  Bed Mobility Overal bed mobility: Needs Assistance Bed Mobility: Supine to Sit     Supine to sit: Min assist     General bed mobility comments: HOB was elevated and Pt used rail to turn to the right side when moving to EOB. Pt required assistance to move LLE across to EOB.  Transfers Overall transfer level: Needs assistance Equipment used: Rolling walker (2 wheeled);1 person hand held assist Transfers: Sit to/from Stand Sit to Stand: Min assist         General transfer comment: Pt required min assist to go from sit to stand, but was a min guard once ambulating.  Ambulation/Gait Ambulation/Gait assistance: Min guard Ambulation Distance (Feet): 75 Feet Assistive device: Rolling walker (2 wheeled) Gait Pattern/deviations: Step-to pattern;Decreased step length - left;Antalgic Gait velocity: decreased Gait velocity interpretation: Below normal speed for age/gender General Gait Details: Pt utilized a step-through gait pattern on the right and a step-to gait pattern on the left. Pt commented on difficulty to not shuffle left foot, and had  to focus on clearing her left toes when stepping. Pt's left toes in gait were pointed laterally, and Pt reported this was new. Will need to continue to monitor her out-toeing on left side and encourage her to keep toes straight ahead.         Balance Overall balance assessment: Needs assistance Sitting-balance support: Feet supported;Bilateral upper extremity supported Sitting balance-Leahy Scale: Fair Sitting balance - Comments: Pt is able to static sit EOB with supervision, but Pt is holding on to EOB with both hands with her feet on the floor while maintaining her balance.    Standing balance support: Bilateral upper extremity supported Standing balance-Leahy Scale: Poor Standing balance comment: Pt is able to maintain standing balance with RW with min guard for safety.                              Pertinent Vitals/Pain Pain Assessment: 0-10 Pain Score: 4  Pain Location: left knee Pain Descriptors / Indicators: Aching;Burning Pain Intervention(s): Limited activity within patient's tolerance;Monitored during session;Repositioned    Home Living Family/patient expects to be discharged to:: Private residence Living Arrangements: Spouse/significant other Available Help at Discharge: Family;Available 24 hours/day Type of Home: House Home Access: Stairs to enter Entrance Stairs-Rails: Left (will wall on right side) Entrance Stairs-Number of Steps: 3 Home Layout: One level Home Equipment: Walker - 2 wheels;Shower seat;Cane - single point;Bedside commode Additional Comments: Pt report she already has a RW, shower chair, and portable toilet seat raiser chair.  Prior Function Level of Independence: Independent         Comments: Pt reports having to use the RW due to pain from OA prior to left TKA, but otherwise was able to ambulate independently.     Hand Dominance   Dominant Hand: Right       Communication   Communication: No difficulties  Cognition  Arousal/Alertness: Awake/alert Behavior During Therapy: WFL for tasks assessed/performed Overall Cognitive Status: Within Functional Limits for tasks assessed                         Exercises Total Joint Exercises Ankle Circles/Pumps: AROM;Both;20 reps Quad Sets: AROM;Left;10 reps Towel Squeeze: AROM;Left;10 reps Heel Slides: AROM;Left;10 reps Goniometric ROM: L knee flexion: 64 degrees; L knee extension: lacking 10 degrees from full extension      Assessment/Plan    PT Assessment Patient needs continued PT services  PT Diagnosis Difficulty walking;Abnormality of gait;Generalized weakness;Acute pain   PT Problem List Decreased range of motion;Decreased strength;Decreased activity tolerance;Decreased mobility;Decreased balance;Pain;Decreased knowledge of use of DME  PT Treatment Interventions DME instruction;Gait training;Therapeutic exercise;Patient/family education;Functional mobility training;Therapeutic activities;Stair training;Modalities;Manual techniques   PT Goals (Current goals can be found in the Care Plan section) Acute Rehab PT Goals Patient Stated Goal: Pt wants to return home, travel with husband, and be able to play with grandchildren. PT Goal Formulation: With patient Time For Goal Achievement: 08/29/15 Potential to Achieve Goals: Good    Frequency 7X/week   Barriers to discharge   Pt has supportive husband who is able to help in their one-story home.       End of Session Equipment Utilized During Treatment: Gait belt Activity Tolerance: Patient limited by pain;Patient limited by fatigue (Pt commented her arms fatigued prior to being limted by pain) Patient left: in chair;with call bell/phone within reach;with family/visitor present (in extension foam)           Time: [id number] PT Time Calculation (min) (ACUTE ONLY): 58 min   Charges:   PT Evaluation $PT Eval Moderate Complexity: 1 Procedure PT Treatments $Gait Training: 8-22  mins $Therapeutic Exercise: 8-22 mins $Therapeutic Activity: 8-22 mins   PT G CodesArelia Sneddon, [place] office Arelia Sneddon 08/15/2015, 12:18 PM

## 2015-08-16 LAB — CBC
HCT: 31.1 % — ABNORMAL LOW (ref 36.0–46.0)
HEMOGLOBIN: 10.4 g/dL — AB (ref 12.0–15.0)
MCH: 30.9 pg (ref 26.0–34.0)
MCHC: 33.4 g/dL (ref 30.0–36.0)
MCV: 92.3 fL (ref 78.0–100.0)
PLATELETS: 164 10*3/uL (ref 150–400)
RBC: 3.37 MIL/uL — AB (ref 3.87–5.11)
RDW: 12.8 % (ref 11.5–15.5)
WBC: 4.5 10*3/uL (ref 4.0–10.5)

## 2015-08-16 NOTE — Progress Notes (Signed)
Occupational Therapy Treatment/Discharge Patient Details Name: Lori Randall MRN: [id number] DOB: [birth year] Today's Date: 08/16/2015    History of present illness Pt. is a 65 y.o. female admitted on 08/14/15 for a left TKA due to OA. Pt. has PMH of R TKA, HTN, hx of radiation therapy due to breast cancer.   OT comments  Pt progressing very well. Pt completed all ADLs and functional transfers at min guard-supervision level assist. Pt's husband able to assist with LB ADLs independently. Reviewed fall prevention and pain management strategies. All education has been completed and pt/husband has no further questions. OT signing off.    Follow Up Recommendations  No OT follow up;Supervision - Intermittent    Equipment Recommendations  None recommended by OT    Recommendations for Other Services      Precautions / Restrictions Precautions Precautions: Knee Precaution Booklet Issued: Yes (comment) Precaution Comments: handout given yesterday was reviewed and practiced Restrictions Weight Bearing Restrictions: Yes LLE Weight Bearing: Weight bearing as tolerated       Mobility Bed Mobility Overal bed mobility: Needs Assistance Bed Mobility: Supine to Sit     Supine to sit: Min assist     General bed mobility comments: HOB flat, no use of bedrails to simulate home environment. Very light min assist to support trunk to come to sitting position.  Transfers Overall transfer level: Needs assistance Equipment used: Rolling walker (2 wheeled) Transfers: Sit to/from Stand Sit to Stand: Min assist         General transfer comment: Pt requires min assist to go from sitting in chair to standing. Pt requires assist in holding LLE in neutral position when flexing leg to sit on edge of chair and when straightening LLE when propping feet up in recliner.    Balance Overall balance assessment: Needs assistance Sitting-balance support: No upper extremity supported;Feet  supported Sitting balance-Leahy Scale: Good     Standing balance support: Bilateral upper extremity supported Standing balance-Leahy Scale: Poor Standing balance comment: Pt is able to complete standing balance with min guard for safety.                    ADL Overall ADL's : Needs assistance/impaired     Grooming: Wash/dry hands;Supervision/safety;Standing   Upper Body Bathing: Set up;Sitting   Lower Body Bathing: Minimal assistance;With caregiver independent assisting;Sit to/from stand   Upper Body Dressing : Set up;Sitting   Lower Body Dressing: Minimal assistance;With caregiver independent assisting;Sit to/from stand   Toilet Transfer: Supervision/safety;Ambulation;BSC;RW   Toileting- Clothing Manipulation and Hygiene: Supervision/safety;Sit to/from stand   Tub/ Shower Transfer: Walk-in shower;Min guard;Cueing for sequencing;Ambulation;Rolling walker   Functional mobility during ADLs: Min guard;Rolling walker General ADL Comments: Min guard-supervision for all functional transfers and min assist for LB ADLs with pt's husband independent in assisting. Reviewed fall prevention and pain management strategies.      Vision                     Perception     Praxis      Cognition   Behavior During Therapy: [facility] for tasks assessed/performed Overall Cognitive Status: Within Functional Limits for tasks assessed                       Extremity/Trunk Assessment               Exercises Total Joint Exercises Long Arc Quad: AROM;Left;10 reps Knee Flexion: AROM;10 reps;AAROM (completed AAROM with RLE helping  LLE and AROM L knee flexion) Goniometric ROM: L knee flexion: 62 degrees; L knee extension: lacking 8 degrees from full extension   Shoulder Instructions       General Comments      Pertinent Vitals/ Pain       Pain Assessment: 0-10 Pain Score: 4  Pain Location: left knee Pain Descriptors / Indicators: Aching Pain Intervention(s):  Limited activity within patient's tolerance;Monitored during session;Repositioned;Ice applied;Premedicated before session  Home Living                                          Prior Functioning/Environment              Frequency       Progress Toward Goals  OT Goals(current goals can now be found in the care plan section)  Progress towards OT goals: Goals met/education completed, patient discharged from OT  Acute Rehab OT Goals Patient Stated Goal: to go home OT Goal Formulation: With patient Time For Goal Achievement: 08/29/15 Potential to Achieve Goals: Good ADL Goals Pt Will Perform Lower Body Bathing: with supervision;sit to/from stand Pt Will Perform Lower Body Dressing: with supervision;sit to/from stand Pt Will Transfer to Toilet: with supervision;ambulating;bedside commode Pt Will Perform Toileting - Clothing Manipulation and hygiene: with supervision;sit to/from stand Pt Will Perform Tub/Shower Transfer: Shower transfer;with supervision;ambulating;shower seat;rolling walker;grab bars  Plan All goals met and education completed, patient discharged from OT services    Co-evaluation                 End of Session Equipment Utilized During Treatment: Gait belt;Rolling walker   Activity Tolerance Patient tolerated treatment well   Patient Left in chair;with call bell/phone within reach;with family/visitor present;Other (comment) (with zero degree bone foam applied)   Nurse Communication Mobility status        Time: 9038-3338 OT Time Calculation (min): 26 min  Charges: OT General Charges $OT Visit: 1 Procedure OT Treatments $Self Care/Home Management : 23-37 mins  Redmond Baseman, OTR/L Pager: [phone] 08/16/2015, 10:33 AM

## 2015-08-16 NOTE — Discharge Summary (Signed)
Patient ID: Lori Randall MRN: [id number] DOB/AGE: 66-Mar-1952 66 y.o.  Admit date: 08/14/2015 Discharge date: 08/16/2015  Admission Diagnoses:  Active Problems:   Primary osteoarthritis of left knee   Arthritis of knee   Discharge Diagnoses:  Same  Past Medical History  Diagnosis Date  . Hypertension   . Hx of radiation therapy 03/11/13-04/27/13  . Complication of anesthesia   . PONV (postoperative nausea and vomiting)     2014 after breast surgery  . Shortness of breath dyspnea     with exertion   . Headache     h/o of migraines   . Breast cancer ([place]) 01/13/13    right breast bx=invasive ca ,tx with surgery & radiation   . Arthritis     knees & hands   . Sleep apnea     last study- 2010, Dr. Maxwell Caul , uses CPAP every night     Surgeries: Procedure(s): TOTAL KNEE ARTHROPLASTY on 08/14/2015   Consultants:    Discharged Condition: Improved  Hospital Course: Lori Randall is an 65 y.o. female who was admitted 08/14/2015 for operative treatment of<principal problem not specified>. Patient has severe unremitting pain that affects sleep, daily activities, and work/hobbies. After pre-op clearance the patient was taken to the operating room on 08/14/2015 and underwent  Procedure(s): TOTAL KNEE ARTHROPLASTY.    Patient was given perioperative antibiotics: Anti-infectives    Start     Dose/Rate Route Frequency Ordered Stop   08/14/15 0815  cefUROXime (ZINACEF) injection  Status:  Discontinued       As needed 08/14/15 0816 08/14/15 0950   08/14/15 0700  ceFAZolin (ANCEF) IVPB 2 g/50 mL premix     2 g 100 mL/hr over 30 Minutes Intravenous To [facility] 08/13/15 1133 08/14/15 0741       Patient was given sequential compression devices, early ambulation, and chemoprophylaxis to prevent DVT.  Patient benefited maximally from hospital stay and there were no complications.    Recent vital signs: Patient Vitals for the past 24 hrs:  BP Temp Temp src Pulse Resp SpO2   08/16/15 0608 131/62 mmHg 99.8 F (37.7 C) Oral 98 18 93 %  08/15/15 2205 (!) 144/55 mmHg 100.1 F (37.8 C) Oral (!) 104 18 94 %  08/15/15 1258 103/79 mmHg 98.4 F (36.9 C) - (!) 118 18 96 %     Recent laboratory studies:  Recent Labs  08/15/15 0410 08/16/15 0704  WBC 6.4 4.5  HGB 10.7* 10.4*  HCT 32.6* 31.1*  PLT 192 164  NA 138  --   K 4.1  --   CL 101  --   CO2 28  --   BUN 10  --   CREATININE 0.67  --   GLUCOSE 125*  --   CALCIUM 9.7  --      Discharge Medications:     Medication List    STOP taking these medications        IBUPROFEN PM 200-38 MG Tabs  Generic drug:  Ibuprofen-Diphenhydramine Cit      TAKE these medications        amoxicillin 500 MG capsule  Commonly known as:  AMOXIL  Take 1,000 mg by mouth See admin instructions. Take 2 capsules (1000 mg) by mouth 2 hours prior to dental appointment and 2 capsules 1 hour after dental appointment     anastrozole 1 MG tablet  Commonly known as:  ARIMIDEX  Take 1 tablet (1 mg total) by mouth daily.  aspirin EC 325 MG tablet  Take 1 tablet (325 mg total) by mouth 2 (two) times daily.     atorvastatin 80 MG tablet  Commonly known as:  LIPITOR  Take 80 mg by mouth daily.     Fish Oil 1200 MG Caps  Take 1,200 mg by mouth daily.     hydrochlorothiazide 25 MG tablet  Commonly known as:  HYDRODIURIL  Take 25 mg by mouth daily.     methocarbamol 500 MG tablet  Commonly known as:  ROBAXIN  Take 1 tablet (500 mg total) by mouth 2 (two) times daily with a meal.     oxyCODONE-acetaminophen 5-325 MG tablet  Commonly known as:  ROXICET  Take 1 tablet by mouth every 4 (four) hours as needed.     PRESCRIPTION MEDICATION  at bedtime. CPAP     ramipril 5 MG capsule  Commonly known as:  ALTACE  Take 5 mg by mouth daily.     traMADol 50 MG tablet  Commonly known as:  ULTRAM  Take 50 mg by mouth See admin instructions. Take 1 tablet (50 mg) by mouth every morning, may take a 2nd tablet later in the  day as needed for pain     Vitamin D3 2000 units Tabs  Take 2,000 Units by mouth daily.        Diagnostic Studies: Dg Femur Port Min 2 Views Left  08/14/2015  CLINICAL DATA:  Status post left knee replacement. EXAM: LEFT FEMUR PORTABLE 2 VIEWS COMPARISON:  None. FINDINGS: AP view of the femur and a lateral view of the distal femur. Status post total knee arthroplasty. On the AP view, there is osseous irregularity about the distal femoral shaft medially. approximately 12 cm proximal to the articular level. Adjacent increased density could be within the subcutaneous tissues. No well localized correlate on the lateral view. Air within the joint, as expected. IMPRESSION: Status post right knee arthroplasty. Osseous irregularity about the medial distal femoral shaft, only on the AP view. Likely related to remote trauma. Electronically Signed   By: Abigail Miyamoto M.D.   On: 08/14/2015 10:45    Disposition: 06-Home-Health Care Svc      Discharge Instructions    CPM    Complete by:  As directed   Continuous passive motion machine (CPM):      Use the CPM from 0 to 60  for 5 hours per day.      You may increase by 10 degrees per day.  You may break it up into 2 or 3 sessions per day.      Use CPM for 2 weeks or until you are told to stop.     Call MD / Call 911    Complete by:  As directed   If you experience chest pain or shortness of breath, CALL 911 and be transported to the hospital emergency room.  If you develope a fever above 101 F, pus (white drainage) or increased drainage or redness at the wound, or calf pain, call your surgeon's office.     Change dressing    Complete by:  As directed   Change dressing on 5, then change the dressing daily with sterile 4 x 4 inch gauze dressing and apply TED hose.  You may clean the incision with alcohol prior to redressing.     Constipation Prevention    Complete by:  As directed   Drink plenty of fluids.  Prune juice may be helpful.  You may use  a stool  softener, such as Colace (over the counter) 100 mg twice a day.  Use MiraLax (over the counter) for constipation as needed.     Diet - low sodium heart healthy    Complete by:  As directed      Driving restrictions    Complete by:  As directed   No driving for 2 weeks     Increase activity slowly as tolerated    Complete by:  As directed      Patient may shower    Complete by:  As directed   You may shower without a dressing once there is no drainage.  Do not wash over the wound.  If drainage remains, cover wound with plastic wrap and then shower.           Follow-up Information    Follow up with Kerin Salen, MD In 2 weeks.   Specialty:  Orthopedic Surgery   Contact information:   [place] [id number] [phone]       Follow up with [facility].   Why:  Someone from [facility] will contact you concerning start date and time for therapy.   Contact information:   [place] [address] [phone]        Signed: Hardin Negus, Shreyas Piatkowski R 08/16/2015, 8:09 AM

## 2015-08-16 NOTE — Progress Notes (Signed)
Patient and patient's husband stated that they were very upset that her PRN oxy was not a scheduled thing. Husband stated that he thought at one point it was scheduled and wonders why the doctor would stop it on post-op day 2. Rn explained to patient and husband the drugs are indeed available but she would just have to ask for it. Patient also stated that at one point she was receiving all her pain medication and muscle relaxer at once and can not understand why that changed as well. RN explain to patient that we can not give all medications at once. Will continue to monitor

## 2015-08-16 NOTE — Progress Notes (Signed)
PATIENT ID: Lori Randall  MRN: [id number]  DOB/AGE:  01-01-1951 / 65 y.o.  2 Days Post-Op Procedure(s) (LRB): TOTAL KNEE ARTHROPLASTY (Left)    PROGRESS NOTE Subjective: Patient is alert, oriented, no Nausea, no Vomiting, yes passing gas. Taking PO well. Denies SOB, Chest or Calf Pain. Using Incentive Spirometer, PAS in place. Ambulate WBAT with pt walking 55 ft withtherapy, CPM 0-40 Patient reports pain as mild   Objective: Vital signs in last 24 hours: Filed Vitals:   08/15/15 0643 08/15/15 1258 08/15/15 2205 08/16/15 0608  BP: 134/73 103/79 144/55 131/62  Pulse: 104 118 104 98  Temp: 98.3 F (36.8 C) 98.4 F (36.9 C) 100.1 F (37.8 C) 99.8 F (37.7 C)  TempSrc: Oral  Oral Oral  Resp: 18 18 18 18   Weight:      SpO2: 96% 96% 94% 93%      Intake/Output from previous day: I/O last 3 completed shifts: In: 2085 [P.O.:960; I.V.:1125] Out: -    Intake/Output this shift: Total I/O In: 360 [P.O.:360] Out: -    LABORATORY DATA:  Recent Labs  08/15/15 0410 08/16/15 0704  WBC 6.4 4.5  HGB 10.7* 10.4*  HCT 32.6* 31.1*  PLT 192 164  NA 138  --   K 4.1  --   CL 101  --   CO2 28  --   BUN 10  --   CREATININE 0.67  --   GLUCOSE 125*  --   CALCIUM 9.7  --     Examination: Neurologically intact Neurovascular intact Sensation intact distally Intact pulses distally Dorsiflexion/Plantar flexion intact Incision: dressing C/D/I No cellulitis present Compartment soft}  Assessment:   2 Days Post-Op Procedure(s) (LRB): TOTAL KNEE ARTHROPLASTY (Left) ADDITIONAL DIAGNOSIS: Expected Acute Blood Loss Anemia,   Plan: PT/OT WBAT, CPM 5/hrs day until ROM 0-90 degrees, then D/C CPM DVT Prophylaxis:  SCDx72hrs, ASA 325 mg BID x 2 weeks DISCHARGE PLAN: Home, later today when passes therapy goals. DISCHARGE NEEDS: HHPT, CPM, Walker and 3-in-1 comode seat     PHILLIPS, ERIC R 08/16/2015, 8:07 AM

## 2015-08-16 NOTE — Progress Notes (Signed)
Physical Therapy Treatment Patient Details Name: Lori Randall MRN: [id number] DOB: 14-Jan-1951 Today's Date: 08/16/2015    History of Present Illness Pt. is a 65 y.o. female admitted on 08/14/15 for a left TKA due to OA. Pt. has PMH of R TKA, HTN, hx of radiation therapy due to breast cancer.    PT Comments    Pt was agreeable and wanted to participate with PT. Pt seems to be willing to push through some pain as she wants to get her knee better. Pt successfully completed the seated TKA exercises,stairs, and ambulated 30 feet. Pt complained of pain at beginning of session, and nurse administered pain medication.   Follow Up Recommendations  Home health PT     Equipment Recommendations  None recommended by PT    Recommendations for Other Services       Precautions / Restrictions Precautions Precautions: Knee Precaution Booklet Issued: Yes (comment) Precaution Comments: handout given yesterday was reviewed and practiced Restrictions Weight Bearing Restrictions: Yes LLE Weight Bearing: Weight bearing as tolerated    Mobility   Transfers Overall transfer level: Needs assistance Equipment used: Rolling walker (2 wheeled) Transfers: Sit to/from Stand Sit to Stand: Min assist         General transfer comment: Pt requires min assist to go from sitting in chair to standing. Pt requires assist in holding LLE in neutral position when flexing leg to sit on edge of chair and when straightening LLE when propping feet up in recliner.  Ambulation/Gait Ambulation/Gait assistance: Min guard Ambulation Distance (Feet): 30 Feet (Ambulated after completing TE and stair training ) Assistive device: Rolling walker (2 wheeled) Gait Pattern/deviations: Step-to pattern;Antalgic Gait velocity: decreased Gait velocity interpretation: Below normal speed for age/gender General Gait Details: Pt utilizes a step-to antalgic gait pattern and reports her arms tire when bearing weight through them  when taking step on LLE.   Stairs Stairs: Yes Stairs assistance: Min assist Stair Management: One rail Left;Step to pattern Number of Stairs: 4 General stair comments: Pt faced the left railing and held on to rail with both hands to bear weight when having to shift weight to LLE. Once Pt had confidence in completing the task, she completed them quicker and with less trouble. Pt reported she felt confident she could complete the stairs to enter her home due to our practice and with the help of her husband.         Balance Overall balance assessment: Needs assistance Sitting-balance support: No upper extremity supported;Feet supported Sitting balance-Leahy Scale: Good     Standing balance support: Bilateral upper extremity supported Standing balance-Leahy Scale: Poor Standing balance comment: Pt is able to complete standing balance with min guard for safety.                     Cognition Arousal/Alertness: Awake/alert Behavior During Therapy: WFL for tasks assessed/performed Overall Cognitive Status: Within Functional Limits for tasks assessed                      Exercises Total Joint Exercises Long Arc Quad: AROM;Left;10 reps Knee Flexion: AROM;10 reps;AAROM (completed AAROM with RLE helping LLE and AROM L knee flexion) Goniometric ROM: L knee flexion: 62 degrees; L knee extension: lacking 8 degrees from full extension    General Comments        Pertinent Vitals/Pain Pain Assessment: 0-10 Pain Score: 4  Pain Location: left knee Pain Descriptors / Indicators: Aching Pain Intervention(s): Limited activity within patient's  tolerance;Monitored during session;Repositioned;Ice applied;Premedicated before session           PT Goals (current goals can now be found in the care plan section) Acute Rehab PT Goals Patient Stated Goal: to go home PT Goal Formulation: With patient Time For Goal Achievement: 08/29/15 Potential to Achieve Goals: Good Progress  towards PT goals: Progressing toward goals    Frequency  7X/week    PT Plan Current plan remains appropriate       End of Session Equipment Utilized During Treatment: Gait belt Activity Tolerance: Patient limited by fatigue;Patient limited by pain Patient left: in chair;with call bell/phone within reach (with ice )     Time: [id number] PT Time Calculation (min) (ACUTE ONLY): 44 min  Charges:    2 gait, 1 therapeutic exercise                     [place], [place] Arelia Sneddon 08/16/2015, 12:06 PM

## 2015-09-22 ENCOUNTER — Other Ambulatory Visit: Payer: Self-pay

## 2015-09-22 DIAGNOSIS — C50411 Malignant neoplasm of upper-outer quadrant of right female breast: Secondary | ICD-10-CM

## 2015-09-25 ENCOUNTER — Other Ambulatory Visit (HOSPITAL_BASED_OUTPATIENT_CLINIC_OR_DEPARTMENT_OTHER): Payer: Medicare Other

## 2015-09-25 ENCOUNTER — Telehealth: Payer: Self-pay | Admitting: Oncology

## 2015-09-25 ENCOUNTER — Ambulatory Visit (HOSPITAL_BASED_OUTPATIENT_CLINIC_OR_DEPARTMENT_OTHER): Payer: Medicare Other | Admitting: Oncology

## 2015-09-25 VITALS — BP 152/69 | HR 77 | Temp 98.2°F | Resp 18 | Ht 66.5 in | Wt 222.0 lb

## 2015-09-25 DIAGNOSIS — C50411 Malignant neoplasm of upper-outer quadrant of right female breast: Secondary | ICD-10-CM

## 2015-09-25 DIAGNOSIS — Z79811 Long term (current) use of aromatase inhibitors: Secondary | ICD-10-CM | POA: Diagnosis not present

## 2015-09-25 DIAGNOSIS — Z170 Estrogen receptor positive status [ER+]: Secondary | ICD-10-CM

## 2015-09-25 LAB — COMPREHENSIVE METABOLIC PANEL
ALT: 23 U/L (ref 0–55)
AST: 19 U/L (ref 5–34)
Albumin: 3.8 g/dL (ref 3.5–5.0)
Alkaline Phosphatase: 142 U/L (ref 40–150)
Anion Gap: 8 mEq/L (ref 3–11)
BILIRUBIN TOTAL: 0.34 mg/dL (ref 0.20–1.20)
BUN: 11.6 mg/dL (ref 7.0–26.0)
CALCIUM: 10.8 mg/dL — AB (ref 8.4–10.4)
CHLORIDE: 105 meq/L (ref 98–109)
CO2: 28 mEq/L (ref 22–29)
CREATININE: 0.7 mg/dL (ref 0.6–1.1)
EGFR: >90 mL/min/{1.73_m2} (ref >90)
Glucose: 94 mg/dl (ref 70–140)
Potassium: 4.1 mEq/L (ref 3.5–5.1)
Sodium: 141 mEq/L (ref 136–145)
TOTAL PROTEIN: 7.2 g/dL (ref 6.4–8.3)

## 2015-09-25 LAB — CBC WITH DIFFERENTIAL/PLATELET
BASO%: 1 % (ref 0.0–2.0)
Basophils Absolute: 0 10*3/uL (ref 0.0–0.1)
EOS%: 2.4 % (ref 0.0–7.0)
Eosinophils Absolute: 0.1 10*3/uL (ref 0.0–0.5)
HEMATOCRIT: 36.8 % (ref 34.8–46.6)
HEMOGLOBIN: 12.3 g/dL (ref 11.6–15.9)
LYMPH#: 0.8 10*3/uL — AB (ref 0.9–3.3)
LYMPH%: 17.7 % (ref 14.0–49.7)
MCH: 29.7 pg (ref 25.1–34.0)
MCHC: 33.3 g/dL (ref 31.5–36.0)
MCV: 89.3 fL (ref 79.5–101.0)
MONO#: 0.5 10*3/uL (ref 0.1–0.9)
MONO%: 10.7 % (ref 0.0–14.0)
NEUT%: 68.2 % (ref 38.4–76.8)
NEUTROS ABS: 3 10*3/uL (ref 1.5–6.5)
Platelets: 229 10*3/uL (ref 145–400)
RBC: 4.13 10*6/uL (ref 3.70–5.45)
RDW: 13.5 % (ref 11.2–14.5)
WBC: 4.4 10*3/uL (ref 3.9–10.3)

## 2015-09-25 NOTE — Telephone Encounter (Signed)
appt made and avs printed °

## 2015-09-25 NOTE — Progress Notes (Signed)
ID: Lori Randall OB: January 22, 1951  MR#: [id number]  UUE#:[id number]  PCP: Osborne Casco, MD GYN:   SU: Fanny Skates OTHER MD: Kelton Pillar, Christene Slates, Kyra Searles  CHIEF COMPLAINT: right breast cancer   CURRENT THERAPY: anastrozole   BREAST CANCER HISTORY: From the earlier summary:  "Lori Randall" had routine screening mammography at [facility] 01/11/2013 showing a potential abnormality in the left breast. Additional views 01/13/2013 found that the calcifications noted in the left breast were likely benign. However on the right a previously noted mass was now irregular in contour. There were also some associated calcifications. A right breast ultrasound found a hypoechoic mass collar than wide measuring 1.2 cm. Biopsy of this mass the same day ([id number]) showed an invasive ductal carcinoma, grade 1, estrogen receptor 100% positive, progesterone receptor 86% positive, with an MIB-1 of 14% and no HER-2 amplification.  Bilateral breast MRIs 01/25/2013 showed a 2.1 cm lobulated enhancing mass in the posterior third of the upper outer quadrant of the right breast there were no other areas of concern in either breast and no enlarged axillary or internal mammary adenopathy.  The patient's subsequent history is as detailed below   INTERVAL HISTORY: Lori Randall returns today for follow up of her estrogen receptor positive breast cancer. She continues on anastrozole, which she tolerates well. Hot flashes, vaginal dryness, and arthralgias/myalgias are not concerns. She obtains a drug essentially free of charge  REVIEW OF SYSTEMS: Lori Randall had her left knee replacement in January. She tells me this is working better than the right and she is getting around well, but she is not on a regular walking program yet. She still needs significant pain medication, taking oxycodone a couple of times a day as well as Robaxin. Aside from this issue, a detailed review of systems today was  noncontributory  PAST MEDICAL HISTORY: Past Medical History  Diagnosis Date  . Hypertension   . Hx of radiation therapy 03/11/13-04/27/13  . Complication of anesthesia   . PONV (postoperative nausea and vomiting)     2014 after breast surgery  . Shortness of breath dyspnea     with exertion   . Headache     h/o of migraines   . Breast cancer ([place]) 01/13/13    right breast bx=invasive ca ,tx with surgery & radiation   . Arthritis     knees & hands   . Sleep apnea     last study- 2010, Dr. Maxwell Caul , uses CPAP every night     PAST SURGICAL HISTORY: Past Surgical History  Procedure Laterality Date  . Excisional biopsy right breast    . Partial mastectomy with needle localization and axillary sentinel lymph node bx Right 02/01/2013    Procedure: PARTIAL MASTECTOMY WITH NEEDLE LOCALIZATION AND AXILLARY SENTINEL LYMPH NODE BX;  Surgeon: Adin Hector, MD;  Location: [place];  Service: General;  Laterality: Right;  needle localization at 7:30 SOLIS nuclear medicine 30 minutes prior to surgery right breast 9:30  . Breast surgery    . Appendectomy      age 65  . Colonoscopy w/ polypectomy    . Total knee arthroplasty Right 08/31/2014    dr Mayer Camel  . Total knee arthroplasty Right 08/31/2014    Procedure: RIGHT TOTAL KNEE ARTHROPLASTY;  Surgeon: Kerin Salen, MD;  Location: [place];  Service: Orthopedics;  Laterality: Right;  . Joint replacement    . Total knee arthroplasty Left 08/14/2015    Procedure: TOTAL KNEE ARTHROPLASTY;  Surgeon: Pilar Plate  Mayer Camel, MD;  Location: [place];  Service: Orthopedics;  Laterality: Left;    FAMILY HISTORY No family history on file. The patient's father died at the age of 90 with congestive 4 Lovena Le in the setting of severe dementia. The patient's mother died at age 78 with atypical parkinsonism. The patient has 3 brothers and 2 sisters. There is no history of breast or ovarian cancer in the family  GYNECOLOGIC HISTORY:  Menarche age 6, first live birth age 19. The  patient is GX P2. She went through menopause approximately 2004. She took hormone replacement approximately 2 years. She took birth control remotely for approximately 10 years, without complications.  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 Her husband Gearldine Shown") M. Surveyor, minerals used to work for [employer]. He is now retired. Daughter Lori Randall is a paramedic in [facility]. Son Lori Randall is a landscaper in [place]. The patient has no grandchildren. She attends a [employer]    ADVANCED DIRECTIVES: In place.   HEALTH MAINTENANCE: Social History  Substance Use Topics  . Smoking status: Never Smoker   . Smokeless tobacco: Never Used  . Alcohol Use: No     Comment: maybe 1 glass of wine a month     Colonoscopy: 2005  PAP: November 2014  Bone density: 06/09/2013 at [facility]; normal  Lipid panel:  No Known Allergies  Current Outpatient Prescriptions  Medication Sig Dispense Refill  . amoxicillin (AMOXIL) 500 MG capsule Take 1,000 mg by mouth See admin instructions. Take 2 capsules (1000 mg) by mouth 2 hours prior to dental appointment and 2 capsules 1 hour after dental appointment    . anastrozole (ARIMIDEX) 1 MG tablet Take 1 tablet (1 mg total) by mouth daily. (Patient taking differently: Take 1 mg by mouth daily with breakfast. ) 30 tablet 0  . aspirin EC 325 MG tablet Take 1 tablet (325 mg total) by mouth 2 (two) times daily. 30 tablet 0  . atorvastatin (LIPITOR) 80 MG tablet Take 80 mg by mouth daily.    . Cholecalciferol (VITAMIN D3) 2000 UNITS TABS Take 2,000 Units by mouth daily.    . hydrochlorothiazide (HYDRODIURIL) 25 MG tablet Take 25 mg by mouth daily.    . methocarbamol (ROBAXIN) 500 MG tablet Take 1 tablet (500 mg total) by mouth 2 (two) times daily with a meal. 60 tablet 0  . Omega-3 Fatty Acids (FISH OIL) 1200 MG CAPS Take 1,200 mg by mouth daily.    Marland Kitchen oxyCODONE-acetaminophen (ROXICET) 5-325 MG tablet Take 1 tablet by mouth  every 4 (four) hours as needed. 60 tablet 0  . PRESCRIPTION MEDICATION at bedtime. CPAP    . ramipril (ALTACE) 5 MG capsule Take 5 mg by mouth daily.    . traMADol (ULTRAM) 50 MG tablet Take 50 mg by mouth See admin instructions. Take 1 tablet (50 mg) by mouth every morning, may take a 2nd tablet later in the day as needed for pain  0   No current facility-administered medications for this visit.    OBJECTIVE: Middle-aged white woman who appears stated age 34 Vitals:   09/25/15 1144  BP: 152/69  Pulse: 77  Temp: 98.2 F (36.8 C)  Resp: 18     Body mass index is 35.3 kg/(m^2).    ECOG FS: 1  Sclerae unicteric, pupils round and equal Oropharynx clear and moist-- no thrush or other lesions No cervical or supraclavicular adenopathy Lungs no rales or rhonchi Heart regular  rate and rhythm Abd soft, obese, nontender, positive bowel sounds MSK no focal spinal tenderness, walks with a cane Neuro: nonfocal, well oriented, appropriate affect Breasts: The right breast is status post lumpectomy and radiation. There is no evidence of disease recurrence. The right axilla is benign. The left breast is unremarkable.  LAB RESULTS:  CMP     Component Value Date/Time   NA 141 09/25/2015 1123   NA 138 08/15/2015 0410   K 4.1 09/25/2015 1123   K 4.1 08/15/2015 0410   CL 101 08/15/2015 0410   CL 105 01/27/2013 1208   CO2 28 09/25/2015 1123   CO2 28 08/15/2015 0410   GLUCOSE 94 09/25/2015 1123   GLUCOSE 125* 08/15/2015 0410   GLUCOSE 123* 01/27/2013 1208   BUN 11.6 09/25/2015 1123   BUN 10 08/15/2015 0410   CREATININE 0.7 09/25/2015 1123   CREATININE 0.67 08/15/2015 0410   CALCIUM 10.8* 09/25/2015 1123   CALCIUM 9.7 08/15/2015 0410   PROT 7.2 09/25/2015 1123   PROT 6.8 03/15/2014 1255   ALBUMIN 3.8 09/25/2015 1123   ALBUMIN 4.5 03/15/2014 1255   AST 19 09/25/2015 1123   AST 25 03/15/2014 1255   ALT 23 09/25/2015 1123   ALT 33 03/15/2014 1255   ALKPHOS 142 09/25/2015 1123    ALKPHOS 119* 03/15/2014 1255   BILITOT 0.34 09/25/2015 1123   BILITOT 0.6 03/15/2014 1255   GFRNONAA >60 08/15/2015 0410   GFRAA >60 08/15/2015 0410    I No results found for: SPEP  Lab Results  Component Value Date   WBC 4.4 09/25/2015   NEUTROABS 3.0 09/25/2015   HGB 12.3 09/25/2015   HCT 36.8 09/25/2015   MCV 89.3 09/25/2015   PLT 229 09/25/2015      Chemistry      Component Value Date/Time   NA 141 09/25/2015 1123   NA 138 08/15/2015 0410   K 4.1 09/25/2015 1123   K 4.1 08/15/2015 0410   CL 101 08/15/2015 0410   CL 105 01/27/2013 1208   CO2 28 09/25/2015 1123   CO2 28 08/15/2015 0410   BUN 11.6 09/25/2015 1123   BUN 10 08/15/2015 0410   CREATININE 0.7 09/25/2015 1123   CREATININE 0.67 08/15/2015 0410      Component Value Date/Time   CALCIUM 10.8* 09/25/2015 1123   CALCIUM 9.7 08/15/2015 0410   ALKPHOS 142 09/25/2015 1123   ALKPHOS 119* 03/15/2014 1255   AST 19 09/25/2015 1123   AST 25 03/15/2014 1255   ALT 23 09/25/2015 1123   ALT 33 03/15/2014 1255   BILITOT 0.34 09/25/2015 1123   BILITOT 0.6 03/15/2014 1255       No results found for: LABCA2  No components found for: LABCA125  No results for input(s): INR in the last 168 hours.  Urinalysis    Component Value Date/Time   COLORURINE YELLOW 08/03/2015 1120    STUDIES: No results found.   ASSESSMENT: 65 y.o. [place], [place] woman status post right breast biopsy 01/13/2013 for a clinical T2 N0, stage IIA invasive ductal carcinoma, grade 1, estrogen receptor 100% positive, progesterone receptor 86% positive, with an MIB-1 of 14% and no HER-2 amplification  (1) status post right lumpectomy and sentinel lymph node dissection 02/01/2013 for a pT2 pN0, stage IIA invasive ductal carcinoma, grade 1, with repeat HER-2 negative and ample margins.  (2) Oncotype DX score of 13 predicts a risk of distant recurrence of 8% within 10 years if the patient's only systemic therapy is tamoxifen for  5 years. It also  predicts no significant benefit from chemotherapy  (3) adjuvant radiation completed 04/27/2013  (4) started anastrozole 06/05/2013  (a) bone density scan November 2014 was normal  PLAN: Lori Randall , which will take [place] to November 2019.  Her calcium has been moderately elevated more or less consistently. I'm going to add a parathyroid hormone level to her next set of labs.  Her next mammogram is due in June. She will be seeing Dr. Laurann Montana around that time. Accordingly she will return to see me in November and from that point we will start seeing her on a once a year basis until she completes her 5 years.  She knows to call for any problems that may develop before her next visit here.Marland Kitchen    Chauncey Cruel, MD   09/25/2015 12:06 PM

## 2015-12-28 ENCOUNTER — Telehealth: Payer: Self-pay | Admitting: *Deleted

## 2015-12-28 NOTE — Telephone Encounter (Signed)
err

## 2015-12-29 ENCOUNTER — Other Ambulatory Visit: Payer: Self-pay | Admitting: Oncology

## 2016-04-17 ENCOUNTER — Telehealth: Payer: Self-pay | Admitting: Oncology

## 2016-04-17 NOTE — Telephone Encounter (Signed)
06/17/2016 appointment rescheduled to 06/24/2016 per patient request. Rescheduled per patient availability.

## 2016-06-17 ENCOUNTER — Other Ambulatory Visit: Payer: Medicare Other

## 2016-06-24 ENCOUNTER — Other Ambulatory Visit (HOSPITAL_BASED_OUTPATIENT_CLINIC_OR_DEPARTMENT_OTHER): Payer: Medicare Other

## 2016-06-24 ENCOUNTER — Ambulatory Visit (HOSPITAL_BASED_OUTPATIENT_CLINIC_OR_DEPARTMENT_OTHER): Payer: Medicare Other | Admitting: Oncology

## 2016-06-24 VITALS — BP 160/76 | HR 81 | Temp 98.1°F | Resp 18 | Ht 66.5 in | Wt 227.1 lb

## 2016-06-24 DIAGNOSIS — C50411 Malignant neoplasm of upper-outer quadrant of right female breast: Secondary | ICD-10-CM

## 2016-06-24 DIAGNOSIS — Z170 Estrogen receptor positive status [ER+]: Secondary | ICD-10-CM | POA: Diagnosis not present

## 2016-06-24 DIAGNOSIS — Z79811 Long term (current) use of aromatase inhibitors: Secondary | ICD-10-CM

## 2016-06-24 LAB — CBC WITH DIFFERENTIAL/PLATELET
BASO%: 1.4 % (ref 0.0–2.0)
Basophils Absolute: 0.1 10*3/uL (ref 0.0–0.1)
EOS ABS: 0.1 10*3/uL (ref 0.0–0.5)
EOS%: 1.3 % (ref 0.0–7.0)
HEMATOCRIT: 41 % (ref 34.8–46.6)
HGB: 13.7 g/dL (ref 11.6–15.9)
LYMPH#: 1.1 10*3/uL (ref 0.9–3.3)
LYMPH%: 19.2 % (ref 14.0–49.7)
MCH: 30.8 pg (ref 25.1–34.0)
MCHC: 33.5 g/dL (ref 31.5–36.0)
MCV: 92 fL (ref 79.5–101.0)
MONO#: 0.4 10*3/uL (ref 0.1–0.9)
MONO%: 7.1 % (ref 0.0–14.0)
NEUT%: 71 % (ref 38.4–76.8)
NEUTROS ABS: 3.9 10*3/uL (ref 1.5–6.5)
PLATELETS: 228 10*3/uL (ref 145–400)
RBC: 4.45 10*6/uL (ref 3.70–5.45)
RDW: 13.2 % (ref 11.2–14.5)
WBC: 5.5 10*3/uL (ref 3.9–10.3)

## 2016-06-24 LAB — COMPREHENSIVE METABOLIC PANEL
ALT: 48 U/L (ref 0–55)
ANION GAP: 10 meq/L (ref 3–11)
AST: 30 U/L (ref 5–34)
Albumin: 3.9 g/dL (ref 3.5–5.0)
Alkaline Phosphatase: 144 U/L (ref 40–150)
BILIRUBIN TOTAL: 0.44 mg/dL (ref 0.20–1.20)
BUN: 10.7 mg/dL (ref 7.0–26.0)
CALCIUM: 11.3 mg/dL — AB (ref 8.4–10.4)
CO2: 24 mEq/L (ref 22–29)
CREATININE: 0.7 mg/dL (ref 0.6–1.1)
Chloride: 106 mEq/L (ref 98–109)
EGFR: 88 mL/min/{1.73_m2} — AB (ref >90)
Glucose: 145 mg/dl — ABNORMAL HIGH (ref 70–140)
Potassium: 4.1 mEq/L (ref 3.5–5.1)
Sodium: 140 mEq/L (ref 136–145)
TOTAL PROTEIN: 7.2 g/dL (ref 6.4–8.3)

## 2016-06-24 MED ORDER — ANASTROZOLE 1 MG PO TABS: 1.0000 mg | ORAL_TABLET | Freq: Every day | ORAL | 4 refills | Status: DC

## 2016-06-24 NOTE — Progress Notes (Signed)
ID: [name] OB: [birth year]  MR#: [id number]  EZM#:[id number]  PCP: Osborne Casco, MD GYN:   SU: Fanny Skates OTHER MD: Kelton Pillar, Christene Slates, Kyra Searles  CHIEF COMPLAINT: right breast cancer   CURRENT THERAPY: anastrozole   BREAST CANCER HISTORY: From the earlier summary:  "Lori Randall" had routine screening mammography at [facility] 01/11/2013 showing a potential abnormality in the left breast. Additional views 01/13/2013 found that the calcifications noted in the left breast were likely benign. However on the right a previously noted mass was now irregular in contour. There were also some associated calcifications. A right breast ultrasound found a hypoechoic mass collar than wide measuring 1.2 cm. Biopsy of this mass the same day ([id number]) showed an invasive ductal carcinoma, grade 1, estrogen receptor 100% positive, progesterone receptor 86% positive, with an MIB-1 of 14% and no HER-2 amplification.  Bilateral breast MRIs 01/25/2013 showed a 2.1 cm lobulated enhancing mass in the posterior third of the upper outer quadrant of the right breast there were no other areas of concern in either breast and no enlarged axillary or internal mammary adenopathy.  The patient's subsequent history is as detailed below   INTERVAL HISTORY: Lori Randall returns today for follow up of her estrogen receptor positive breast cancer. She continues on anastrozole, with good tolerance. She obtains it at a good price.  She continues to grieve for her son's death in October 28, 2014. On the plus side she has a second grandchild on the way. She and her husband just returned from a 82 day recuperation OB occult trip across the country.  REVIEW OF SYSTEMS: Lori Randall is now able to do more walking than before, since she had her knee surgery. She denies problems with hot flashes. She does have some vaginal dryness issues and we discussed this today. Overall though she is doing "very well". A detailed  review of systems was otherwise stable.  PAST MEDICAL HISTORY: Past Medical History:  Diagnosis Date  . Arthritis    knees & hands   . Breast cancer ([place]) 01/13/13   right breast bx=invasive ca ,tx with surgery & radiation   . Complication of anesthesia   . Headache    h/o of migraines   . Hx of radiation therapy 03/11/13-04/27/13  . Hypertension   . PONV (postoperative nausea and vomiting)    2012-10-28 after breast surgery  . Shortness of breath dyspnea    with exertion   . Sleep apnea    last study03-26-2010, Dr. Maxwell Caul , uses CPAP every night     PAST SURGICAL HISTORY: Past Surgical History:  Procedure Laterality Date  . APPENDECTOMY     age 79  . BREAST SURGERY    . COLONOSCOPY W/ POLYPECTOMY    . excisional biopsy right breast    . JOINT REPLACEMENT    . PARTIAL MASTECTOMY WITH NEEDLE LOCALIZATION AND AXILLARY SENTINEL LYMPH NODE BX Right 02/01/2013   Procedure: PARTIAL MASTECTOMY WITH NEEDLE LOCALIZATION AND AXILLARY SENTINEL LYMPH NODE BX;  Surgeon: Adin Hector, MD;  Location: [place];  Service: General;  Laterality: Right;  needle localization at 7:30 SOLIS nuclear medicine 30 minutes prior to surgery right breast 9:30  . TOTAL KNEE ARTHROPLASTY Right 08/31/2014   dr Mayer Camel  . TOTAL KNEE ARTHROPLASTY Right 08/31/2014   Procedure: RIGHT TOTAL KNEE ARTHROPLASTY;  Surgeon: Kerin Salen, MD;  Location: [place];  Service: Orthopedics;  Laterality: Right;  . TOTAL KNEE ARTHROPLASTY Left 08/14/2015   Procedure: TOTAL KNEE ARTHROPLASTY;  Surgeon: Frederik Pear, MD;  Location: [place];  Service: Orthopedics;  Laterality: Left;    FAMILY HISTORY No family history on file. The patient's father died at the age of 61 with congestive 4 Lori Randall in the setting of severe dementia. The patient's mother died at age 59 with atypical parkinsonism. The patient has 3 brothers and 2 sisters. There is no history of breast or ovarian cancer in the family  GYNECOLOGIC HISTORY:  Menarche age 89, first live  birth age 62. The patient is GX P2. She went through menopause approximately 09/30/02. She took hormone replacement approximately 2 years. She took birth control remotely for approximately 10 years, without complications.  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 Her husband Lori Randall") M. Surveyor, minerals used to work for [employer]. He is now retired. Daughter Lori Randall is a paramedic in [facility] [employer]. Son Lori Randall died in September 30, 2014 possibly from a drug overdose.. The patient has 1 grandchild "and one on the way".. She attends a [employer]    ADVANCED DIRECTIVES: In place.   HEALTH MAINTENANCE: Social History  Substance Use Topics  . Smoking status: Never Smoker  . Smokeless tobacco: Never Used  . Alcohol use No     Comment: maybe 1 glass of wine a month     Colonoscopy: October 01, 2003  PAP: November 2014  Bone density: 06/09/2013 at [facility]; normal  Lipid panel:  No Known Allergies  Current Outpatient Prescriptions  Medication Sig Dispense Refill  . amoxicillin (AMOXIL) 500 MG capsule Take 1,000 mg by mouth See admin instructions. Take 2 capsules (1000 mg) by mouth 2 hours prior to dental appointment and 2 capsules 1 hour after dental appointment    . anastrozole (ARIMIDEX) 1 MG tablet Take 1 tablet (1 mg total) by mouth daily. 90 tablet 4  . atorvastatin (LIPITOR) 80 MG tablet Take 80 mg by mouth daily.    . hydrochlorothiazide (HYDRODIURIL) 25 MG tablet Take 25 mg by mouth daily.    . methocarbamol (ROBAXIN) 500 MG tablet Take 1 tablet (500 mg total) by mouth 2 (two) times daily with a meal. 60 tablet 0  . Omega-3 Fatty Acids (FISH OIL) 1200 MG CAPS Take 1,200 mg by mouth daily.    Marland Kitchen PRESCRIPTION MEDICATION at bedtime. CPAP    . ramipril (ALTACE) 5 MG capsule Take 5 mg by mouth daily.     No current facility-administered medications for this visit.     OBJECTIVE: Middle-aged white woman Who appears well Vitals:   06/24/16 1444  BP:  (!) 160/76  Pulse: 81  Resp: 18  Temp: 98.1 F (36.7 C)     Body mass index is 36.11 kg/m.    ECOG FS: 0  Sclerae unicteric, EOMs intact Oropharynx clear and moist No cervical or supraclavicular adenopathy Lungs no rales or rhonchi Heart regular rate and rhythm Abd soft, nontender, positive bowel sounds MSK no focal spinal tenderness, no upper extremity lymphedema Neuro: nonfocal, well oriented, appropriate affect Breasts: The right breast is status post surgery followed by radiation. There is no evidence of local recurrence. The right axilla is benign. Left breast is unremarkable.   LAB RESULTS:  CMP     Component Value Date/Time   NA 140 06/24/2016 1340   K 4.1 06/24/2016 1340   CL 101 08/15/2015 0410   CL 105 01/27/2013 1208   CO2 24 06/24/2016 1340   GLUCOSE 145 (H) 06/24/2016 1340   GLUCOSE 123 (H)  01/27/2013 1208   BUN 10.7 06/24/2016 1340   CREATININE 0.7 06/24/2016 1340   CALCIUM 11.3 (H) 06/24/2016 1340   PROT 7.2 06/24/2016 1340   ALBUMIN 3.9 06/24/2016 1340   AST 30 06/24/2016 1340   ALT 48 06/24/2016 1340   ALKPHOS 144 06/24/2016 1340   BILITOT 0.44 06/24/2016 1340   GFRNONAA >60 08/15/2015 0410   GFRAA >60 08/15/2015 0410    I No results found for: SPEP  Lab Results  Component Value Date   WBC 5.5 06/24/2016   NEUTROABS 3.9 06/24/2016   HGB 13.7 06/24/2016   HCT 41.0 06/24/2016   MCV 92.0 06/24/2016   PLT 228 06/24/2016      Chemistry      Component Value Date/Time   NA 140 06/24/2016 1340   K 4.1 06/24/2016 1340   CL 101 08/15/2015 0410   CL 105 01/27/2013 1208   CO2 24 06/24/2016 1340   BUN 10.7 06/24/2016 1340   CREATININE 0.7 06/24/2016 1340      Component Value Date/Time   CALCIUM 11.3 (H) 06/24/2016 1340   ALKPHOS 144 06/24/2016 1340   AST 30 06/24/2016 1340   ALT 48 06/24/2016 1340   BILITOT 0.44 06/24/2016 1340       No results found for: LABCA2  No components found for: LABCA125  No results for input(s): INR in  the last 168 hours.  Urinalysis    Component Value Date/Time   COLORURINE YELLOW 08/03/2015 1120    STUDIES: No results found.   ASSESSMENT: 65 y.o. [place], [place] woman status post right breast biopsy 01/13/2013 for a clinical T2 N0, stage IIA invasive ductal carcinoma, grade 1, estrogen receptor 100% positive, progesterone receptor 86% positive, with an MIB-1 of 14% and no HER-2 amplification  (1) status post right lumpectomy and sentinel lymph node dissection 02/01/2013 for a pT2 pN0, stage IIA invasive ductal carcinoma, grade 1, with repeat HER-2 negative and ample margins.  (2) Oncotype DX score of 13 predicts a risk of distant recurrence of 8% within 10 years if the patient's only systemic therapy is tamoxifen for 5 years. It also predicts no significant benefit from chemotherapy  (3) adjuvant radiation completed 04/27/2013  (4) started anastrozole 06/05/2013  (a) bone density scan November 2014 was normal  PLAN: Lori Randall is now 3-1/2 years out from definitive surgery for her breast cancer with no evidence of disease recurrence. This is very favorable  She is tolerating anastrozole well and the plan is to continue that for a total of 5 years.  We obtained a parathyroid hormone level today. I also suggested she stop her vitamin D given her high calcium levels. These however may be related to the hydrochlorothiazide. If the PTH is negative, perhaps a different antihypertensive could be considered  She will see me again in one year. She knows to call for any problems that may develop before that visit.     Chauncey Cruel, MD   06/24/2016 8:28 PM

## 2016-06-25 LAB — PTH, INTACT AND CALCIUM
Calcium, Ser: 11.2 mg/dL — ABNORMAL HIGH (ref 8.7–10.3)
PTH, Intact: 50 pg/mL (ref 15–65)

## 2016-06-28 ENCOUNTER — Telehealth: Payer: Self-pay

## 2016-06-28 MED ORDER — ANASTROZOLE 1 MG PO TABS: 1.0000 mg | ORAL_TABLET | Freq: Every day | ORAL | 4 refills | Status: DC

## 2016-06-28 NOTE — Telephone Encounter (Signed)
Pt called stating anastrazole was sent to wrong pharmacy. Will sent Rx to [facility]. Pt will call [facility] and cancel. She has enough at home for this transaction.

## 2016-06-30 ENCOUNTER — Encounter: Payer: Self-pay | Admitting: Oncology

## 2016-07-27 IMAGING — DX DG FEMUR 2+V PORT*L*
3 series · 3 of 3 positions shown · non-contrast
Comparison: None.

CLINICAL DATA: Status post left knee replacement.

EXAM:
LEFT FEMUR PORTABLE 2 VIEWS

[femur ap (1 of 2)]
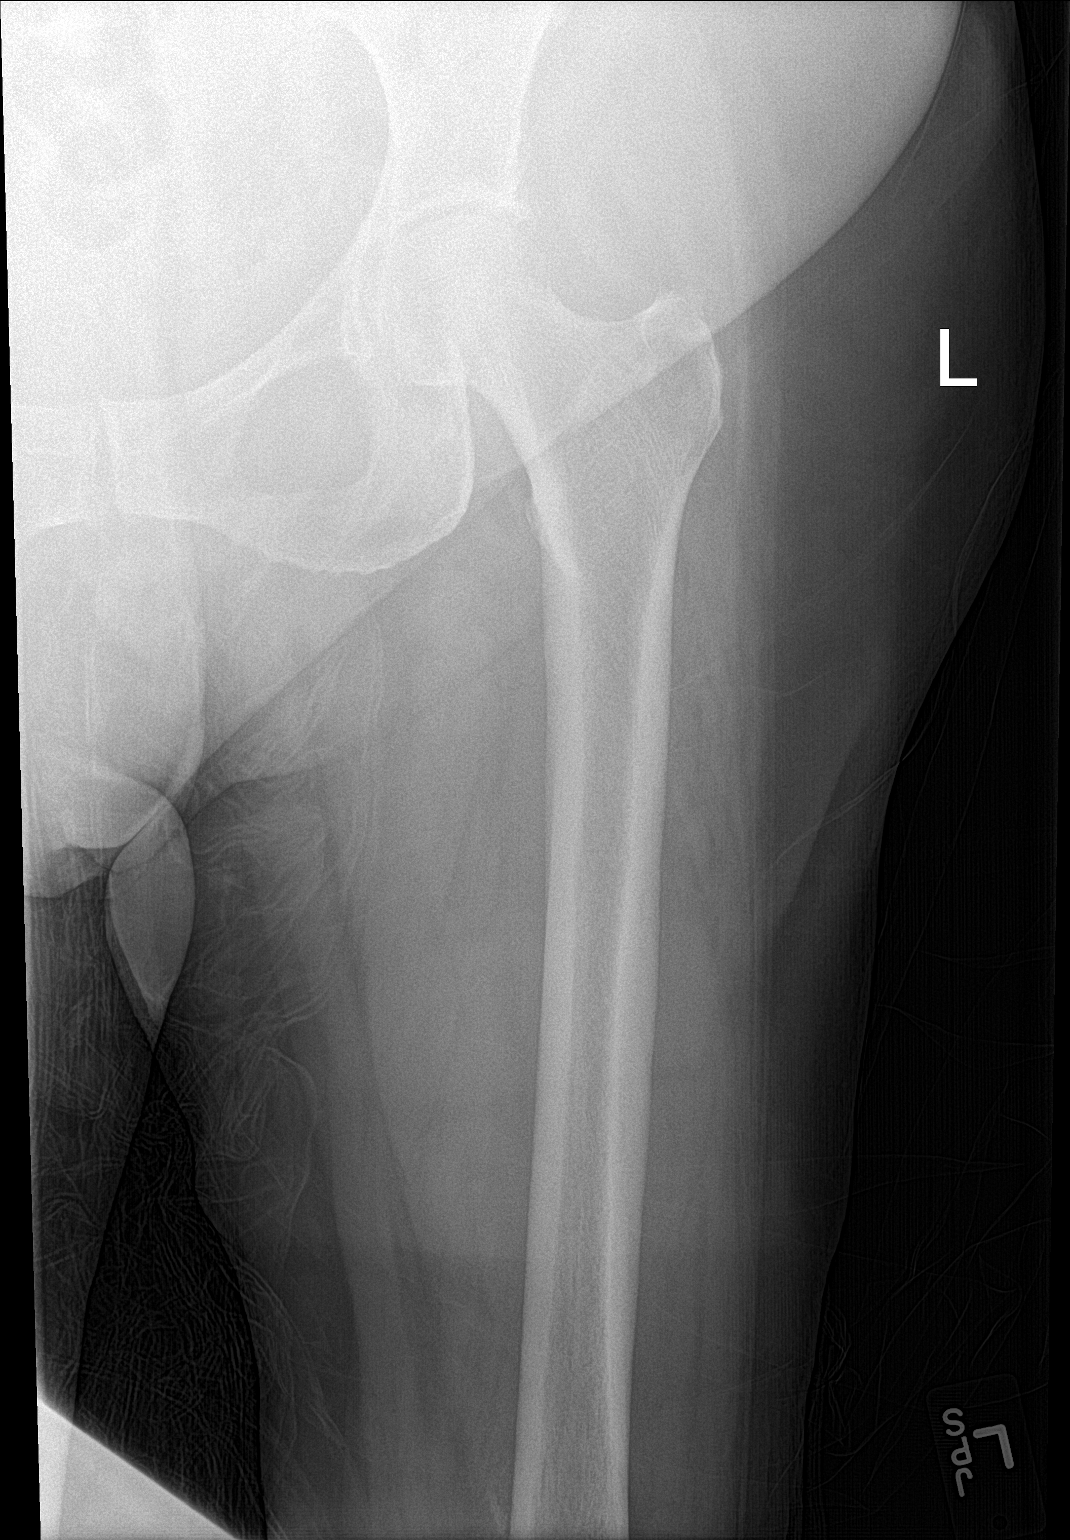

[femur ap (2 of 2)]
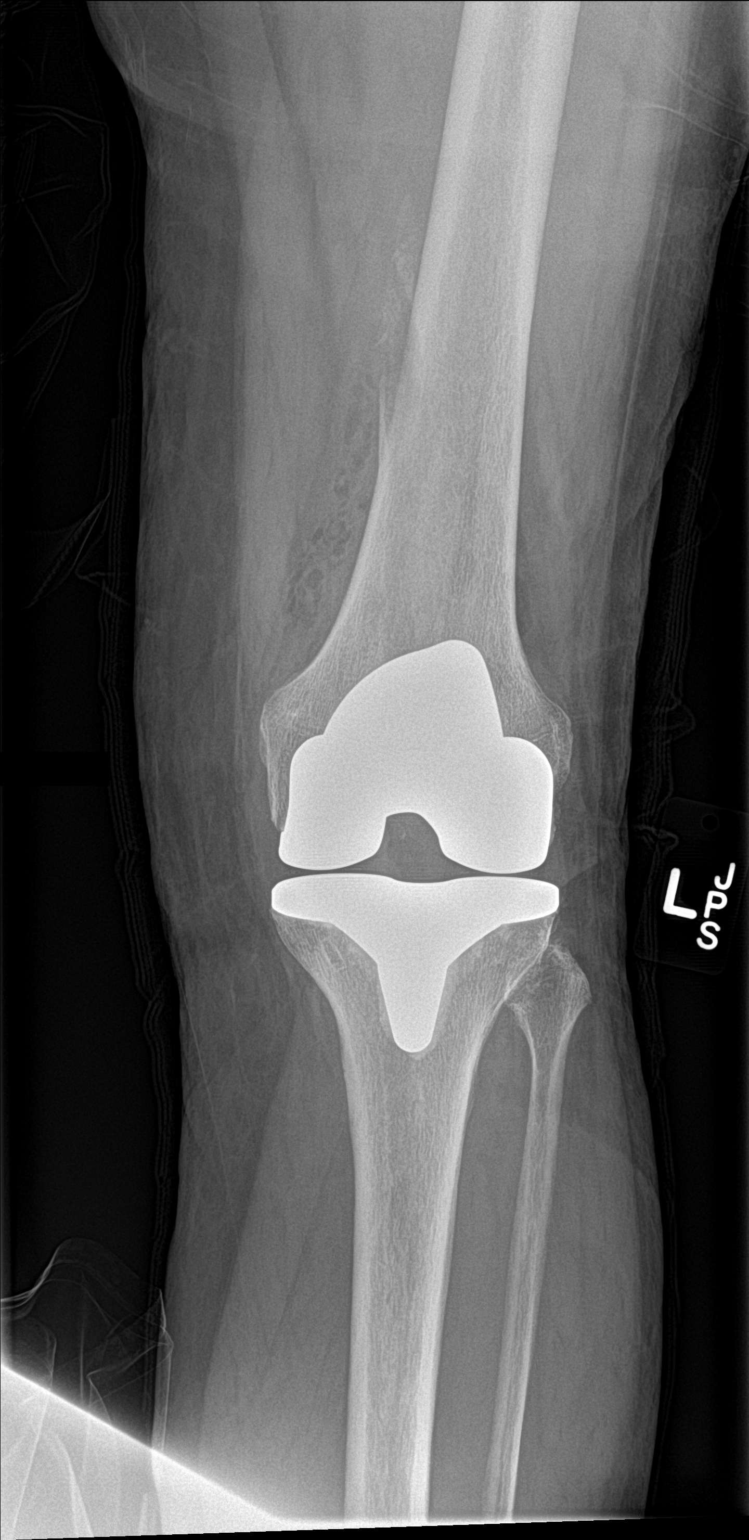

[femur lat]
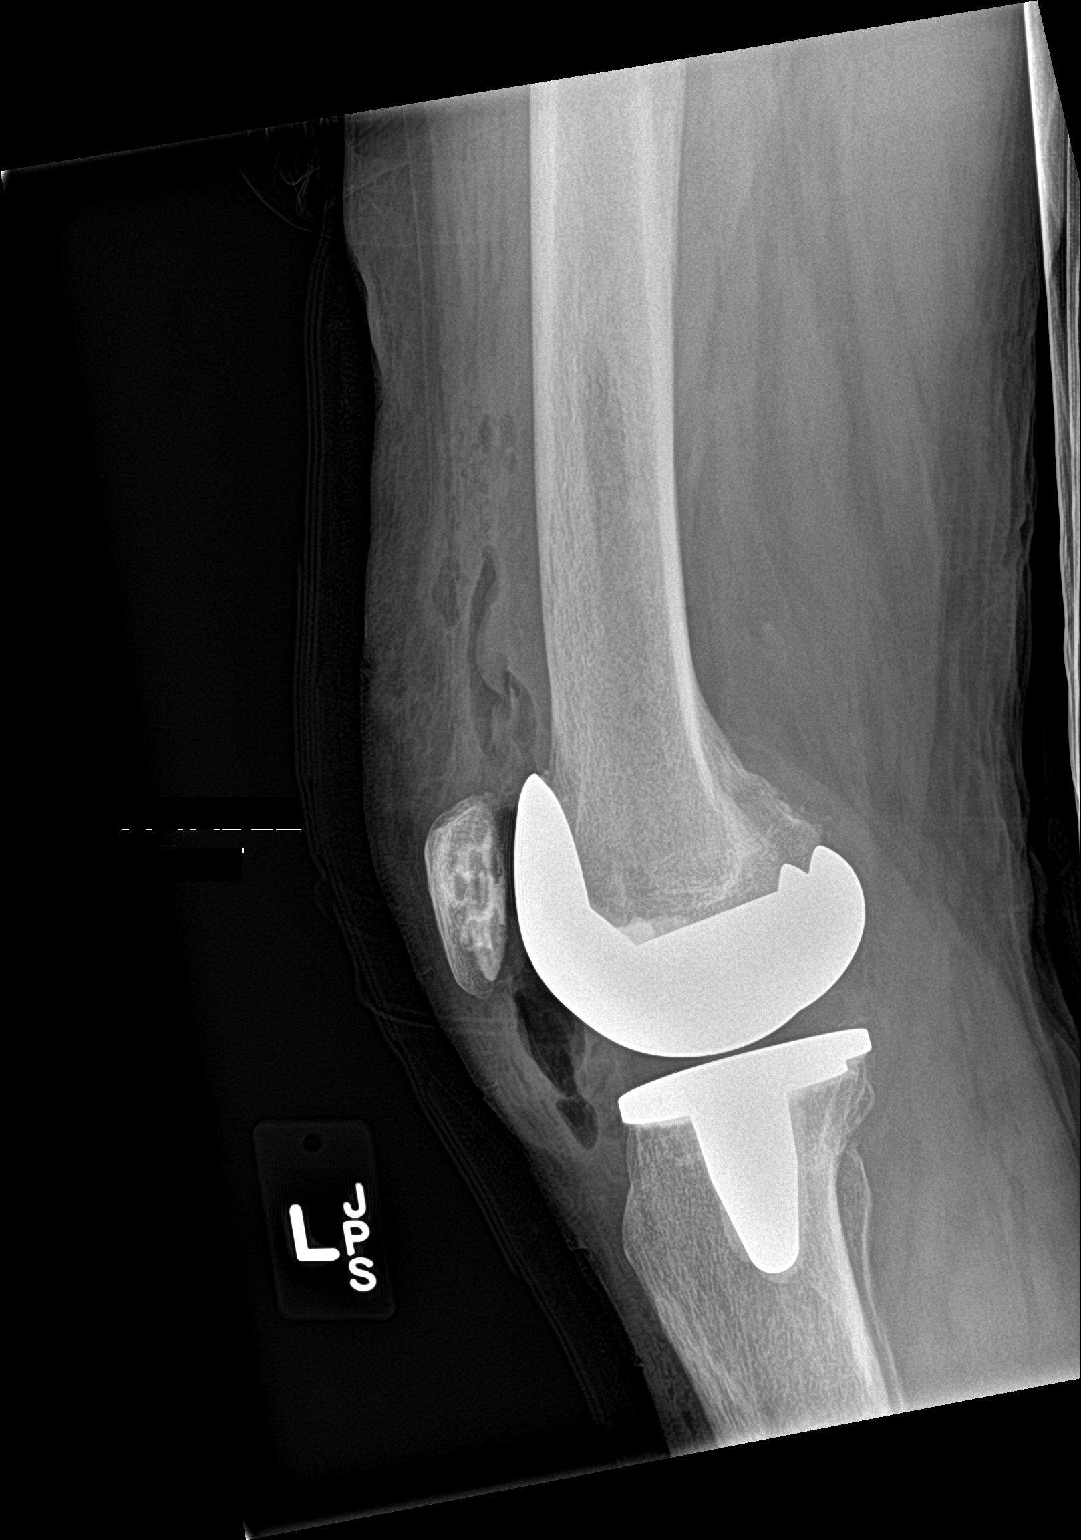

[3 of 3 positions shown; findings below may reference images not displayed]

FINDINGS: AP view of the femur and a lateral view of the distal femur. Status
post total knee arthroplasty. On the AP view, there is osseous
irregularity about the distal femoral shaft medially. approximately
12 cm proximal to the articular level. Adjacent increased density
could be within the subcutaneous tissues. No well localized
correlate on the lateral view. Air within the joint, as expected.
IMPRESSION: Status post right knee arthroplasty.

Osseous irregularity about the medial distal femoral shaft, only on
the AP view. Likely related to remote trauma.

## 2017-02-26 ENCOUNTER — Other Ambulatory Visit: Payer: Self-pay | Admitting: Radiology

## 2017-06-16 ENCOUNTER — Other Ambulatory Visit: Payer: Self-pay

## 2017-06-16 DIAGNOSIS — C50411 Malignant neoplasm of upper-outer quadrant of right female breast: Secondary | ICD-10-CM

## 2017-06-16 DIAGNOSIS — Z170 Estrogen receptor positive status [ER+]: Principal | ICD-10-CM

## 2017-06-17 ENCOUNTER — Other Ambulatory Visit (HOSPITAL_BASED_OUTPATIENT_CLINIC_OR_DEPARTMENT_OTHER): Payer: Medicare Other

## 2017-06-17 DIAGNOSIS — C50411 Malignant neoplasm of upper-outer quadrant of right female breast: Secondary | ICD-10-CM

## 2017-06-17 DIAGNOSIS — Z170 Estrogen receptor positive status [ER+]: Principal | ICD-10-CM

## 2017-06-17 LAB — CBC WITH DIFFERENTIAL/PLATELET
BASO%: 1.1 % (ref 0.0–2.0)
BASOS ABS: 0 10*3/uL (ref 0.0–0.1)
EOS ABS: 0.1 10*3/uL (ref 0.0–0.5)
EOS%: 2.2 % (ref 0.0–7.0)
HCT: 40.7 % (ref 34.8–46.6)
HGB: 13.6 g/dL (ref 11.6–15.9)
LYMPH%: 26.2 % (ref 14.0–49.7)
MCH: 31 pg (ref 25.1–34.0)
MCHC: 33.5 g/dL (ref 31.5–36.0)
MCV: 92.5 fL (ref 79.5–101.0)
MONO#: 0.4 10*3/uL (ref 0.1–0.9)
MONO%: 8.2 % (ref 0.0–14.0)
NEUT#: 2.8 10*3/uL (ref 1.5–6.5)
NEUT%: 62.3 % (ref 38.4–76.8)
PLATELETS: 229 10*3/uL (ref 145–400)
RBC: 4.4 10*6/uL (ref 3.70–5.45)
RDW: 12.9 % (ref 11.2–14.5)
WBC: 4.4 10*3/uL (ref 3.9–10.3)
lymph#: 1.2 10*3/uL (ref 0.9–3.3)

## 2017-06-17 LAB — COMPREHENSIVE METABOLIC PANEL
ALK PHOS: 126 U/L (ref 40–150)
ALT: 43 U/L (ref 0–55)
ANION GAP: 10 meq/L (ref 3–11)
AST: 30 U/L (ref 5–34)
Albumin: 4 g/dL (ref 3.5–5.0)
BUN: 11 mg/dL (ref 7.0–26.0)
CO2: 27 meq/L (ref 22–29)
Calcium: 10.9 mg/dL — ABNORMAL HIGH (ref 8.4–10.4)
Chloride: 105 mEq/L (ref 98–109)
Creatinine: 0.7 mg/dL (ref 0.6–1.1)
GLUCOSE: 105 mg/dL (ref 70–140)
POTASSIUM: 3.6 meq/L (ref 3.5–5.1)
SODIUM: 142 meq/L (ref 136–145)
TOTAL PROTEIN: 7.4 g/dL (ref 6.4–8.3)
Total Bilirubin: 0.57 mg/dL (ref 0.20–1.20)

## 2017-06-23 NOTE — Progress Notes (Signed)
ID: Lori Randall OB: March 29, 1951  MR#: [id number]  DGL#:[id number]  PCP: Kelton Pillar, MD GYN:   SU: Fanny Skates OTHER MD: Kelton Pillar, Christene Slates, Frederik Pear, Nehemiah Settle  CHIEF COMPLAINT: right breast cancer   CURRENT THERAPY: anastrozole   BREAST CANCER HISTORY: From the earlier summary:  "Lori Randall" had routine screening mammography at [facility] 01/11/2013 showing a potential abnormality in the left breast. Additional views 01/13/2013 found that the calcifications noted in the left breast were likely benign. However on the right a previously noted mass was now irregular in contour. There were also some associated calcifications. A right breast ultrasound found a hypoechoic mass collar than wide measuring 1.2 cm. Biopsy of this mass the same day ([id number]) showed an invasive ductal carcinoma, grade 1, estrogen receptor 100% positive, progesterone receptor 86% positive, with an MIB-1 of 14% and no HER-2 amplification.  Bilateral breast MRIs 01/25/2013 showed a 2.1 cm lobulated enhancing mass in the posterior third of the upper outer quadrant of the right breast there were no other areas of concern in either breast and no enlarged axillary or internal mammary adenopathy.  The patient's subsequent history is as detailed below   INTERVAL HISTORY: Lori Randall returns today for follow-up and treatment of her estrogen receptor positive breast cancer.  She continues on anastrozole, with good tolerance. She has some night sweats but notes that it could be due to her large dog running hot. Otherwise she very seldomly has hot flashes. She notes that vaginal dryness is not a major issue, and she uses coconut oil to aid this.  Since her last visit to the office, she underwent diagnostic bilateral mammography and tomography at [facility] on 02/25/2017 revealing: New grouped calcifications in the right breast are suspicious for malignancy. She completed a biopsy of the right breast ([id number]) on  02/26/2017 with results showing: resection site changes with fibrosis and calcifications. No malignancy identified.   REVIEW OF SYSTEMS: Lori Randall reports that she is well overall. She reports that she doesn't complete any regular exercise outside of her normal activities. She notes that walks a few blocks either when its conveient to her or while she is on vacation. She is able to do her own housework. She denies unusual headaches, visual changes, nausea, vomiting, or dizziness. There has been no unusual cough, phlegm production, or pleurisy. This been no change in bowel or bladder habits. She denies unexplained fatigue or unexplained weight loss, bleeding, rash, or fever. A detailed review of systems was otherwise stable.    PAST MEDICAL HISTORY: Past Medical History:  Diagnosis Date  . Arthritis    knees & hands   . Breast cancer ([place]) 01/13/13   right breast bx=invasive ca ,tx with surgery & radiation   . Complication of anesthesia   . Headache    h/o of migraines   . Hx of radiation therapy 03/11/13-04/27/13  . Hypertension   . PONV (postoperative nausea and vomiting)    2014 after breast surgery  . Shortness of breath dyspnea    with exertion   . Sleep apnea    last study- 2010, Dr. Maxwell Caul , uses CPAP every night     PAST SURGICAL HISTORY: Past Surgical History:  Procedure Laterality Date  . APPENDECTOMY     age 20  . BREAST SURGERY    . COLONOSCOPY W/ POLYPECTOMY    . excisional biopsy right breast    . JOINT REPLACEMENT    . PARTIAL MASTECTOMY WITH NEEDLE LOCALIZATION AND AXILLARY SENTINEL LYMPH  NODE BX Right 02/01/2013   Procedure: PARTIAL MASTECTOMY WITH NEEDLE LOCALIZATION AND AXILLARY SENTINEL LYMPH NODE BX;  Surgeon: Adin Hector, MD;  Location: [place];  Service: General;  Laterality: Right;  needle localization at 7:30 SOLIS nuclear medicine 30 minutes prior to surgery right breast 9:30  . TOTAL KNEE ARTHROPLASTY Right 08/31/2014   dr Mayer Camel  . TOTAL KNEE ARTHROPLASTY  Right 08/31/2014   Procedure: RIGHT TOTAL KNEE ARTHROPLASTY;  Surgeon: Kerin Salen, MD;  Location: [place];  Service: Orthopedics;  Laterality: Right;  . TOTAL KNEE ARTHROPLASTY Left 08/14/2015   Procedure: TOTAL KNEE ARTHROPLASTY;  Surgeon: Frederik Pear, MD;  Location: [place];  Service: Orthopedics;  Laterality: Left;    FAMILY HISTORY No family history on file. The patient's father died at the age of 45 with congestive 4 Lovena Le in the setting of severe dementia. The patient's mother died at age 5 with atypical parkinsonism. The patient has 3 brothers and 2 sisters. There is no history of breast or ovarian cancer in the family  GYNECOLOGIC HISTORY:  Menarche age 51, first live birth age 36. The patient is GX P2. She went through menopause approximately 09-29-02. She took hormone replacement approximately 2 years. She took birth control remotely for approximately 10 years, without complications.  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 Her husband Gearldine Shown") M. Surveyor, minerals used to work for [employer]. He is now retired. Daughter Shimika Ames is a paramedic in [employer]. Son Kerry Odonohue died in September 29, 2014 possibly from a drug overdose.. The patient has 1 grandchild "and one on the way".. She attends a [employer]    ADVANCED DIRECTIVES: In place.   HEALTH MAINTENANCE: Social History   Tobacco Use  . Smoking status: Never Smoker  . Smokeless tobacco: Never Used  Substance Use Topics  . Alcohol use: No    Comment: maybe 1 glass of wine a month  . Drug use: No     Colonoscopy: 2003/09/30  PAP: November 2014  Bone density: 06/09/2013 at [facility]; normal  Lipid panel:  No Known Allergies  Current Outpatient Medications  Medication Sig Dispense Refill  . amoxicillin (AMOXIL) 500 MG capsule Take 1,000 mg by mouth See admin instructions. Take 2 capsules (1000 mg) by mouth 2 hours prior to dental appointment and 2 capsules 1 hour after dental  appointment    . anastrozole (ARIMIDEX) 1 MG tablet Take 1 tablet (1 mg total) by mouth daily. 90 tablet 4  . atorvastatin (LIPITOR) 80 MG tablet Take 80 mg by mouth daily.    . hydrochlorothiazide (HYDRODIURIL) 25 MG tablet Take 25 mg by mouth daily.    . methocarbamol (ROBAXIN) 500 MG tablet Take 1 tablet (500 mg total) by mouth 2 (two) times daily with a meal. 60 tablet 0  . Omega-3 Fatty Acids (FISH OIL) 1200 MG CAPS Take 1,200 mg by mouth daily.    Marland Kitchen PRESCRIPTION MEDICATION at bedtime. CPAP    . ramipril (ALTACE) 5 MG capsule Take 5 mg by mouth daily.     No current facility-administered medications for this visit.     OBJECTIVE: Middle-aged white woman in no acute distress  Vitals:   06/24/17 1259  BP: (!) 169/72  Pulse: 96  Resp: (!) 24  Temp: 98.1 F (36.7 C)  SpO2: 97%     Body mass index is 36.19 kg/m.    ECOG FS: 1  Sclerae unicteric, pupils round and equal Oropharynx  clear and moist No cervical or supraclavicular adenopathy Lungs no rales or rhonchi Heart regular rate and rhythm Abd soft, nontender, positive bowel sounds MSK no focal spinal tenderness, no upper extremity lymphedema Neuro: nonfocal, well oriented, appropriate affect Breasts: The right breast has undergone lumpectomy followed by radiation with no evidence of local recurrence.  Left breast is benign.  Both axillae are benign  LAB RESULTS:  CMP     Component Value Date/Time   NA 142 06/17/2017 1236   K 3.6 06/17/2017 1236   CL 101 08/15/2015 0410   CL 105 01/27/2013 1208   CO2 27 06/17/2017 1236   GLUCOSE 105 06/17/2017 1236   GLUCOSE 123 (H) 01/27/2013 1208   BUN 11.0 06/17/2017 1236   CREATININE 0.7 06/17/2017 1236   CALCIUM 10.9 (H) 06/17/2017 1236   PROT 7.4 06/17/2017 1236   ALBUMIN 4.0 06/17/2017 1236   AST 30 06/17/2017 1236   ALT 43 06/17/2017 1236   ALKPHOS 126 06/17/2017 1236   BILITOT 0.57 06/17/2017 1236   GFRNONAA >60 08/15/2015 0410   GFRAA >60 08/15/2015 0410    I No  results found for: SPEP  Lab Results  Component Value Date   WBC 4.4 06/17/2017   NEUTROABS 2.8 06/17/2017   HGB 13.6 06/17/2017   HCT 40.7 06/17/2017   MCV 92.5 06/17/2017   PLT 229 06/17/2017      Chemistry      Component Value Date/Time   NA 142 06/17/2017 1236   K 3.6 06/17/2017 1236   CL 101 08/15/2015 0410   CL 105 01/27/2013 1208   CO2 27 06/17/2017 1236   BUN 11.0 06/17/2017 1236   CREATININE 0.7 06/17/2017 1236      Component Value Date/Time   CALCIUM 10.9 (H) 06/17/2017 1236   ALKPHOS 126 06/17/2017 1236   AST 30 06/17/2017 1236   ALT 43 06/17/2017 1236   BILITOT 0.57 06/17/2017 1236       No results found for: LABCA2  No components found for: LABCA125  No results for input(s): INR in the last 168 hours.  Urinalysis    Component Value Date/Time   COLORURINE YELLOW 08/03/2015 1120    STUDIES: Since her last visit to the office, she completed a biopsy of the right breast ([id number]) on 02/26/2017 with results showing: resection site changes with fibrosis and calcifications. No malignancy identified.   ASSESSMENT: 66 y.o. [place], [place] woman status post right breast biopsy 01/13/2013 for a clinical T2 N0, stage IIA invasive ductal carcinoma, grade 1, estrogen receptor 100% positive, progesterone receptor 86% positive, with an MIB-1 of 14% and no HER-2 amplification  (1) status post right lumpectomy and sentinel lymph node dissection 02/01/2013 for a pT2 pN0, stage IIA invasive ductal carcinoma, grade 1, with repeat HER-2 negative and ample margins.  (2) Oncotype DX score of 13 predicts a risk of distant recurrence of 8% within 10 years if the patient's only systemic therapy is tamoxifen for 5 years. It also predicts no significant benefit from chemotherapy  (3) adjuvant radiation completed 04/27/2013  (4) started anastrozole 06/05/2013  (a) bone density scan November 2014 was normal  PLAN: Lori Randall is now 4-1/2 years out from definitive surgery for  breast cancer with no evidence of disease recurrence.  This is very favorable.  She continues on anastrozole, with good tolerance.  She has 1 more year to go.  That is going to take is to October 2019, at which time she will be ready to "graduate".  We reviewed  her serum calcium which remains high although not dangerously so.  Her parathyroid hormone level last year was normal.  Very likely the hydrochlorothiazide is the culprit.  She is going to be discussing that with Dr. Evalee Mutton at their next visit, in December  Otherwise she knows to call for any problems that may develop before the next visit.   Magrinat, Virgie Dad, MD  06/24/17 1:22 PM Medical Oncology and Hematology [facility] [address] [place], [place] [address] Tel. [phone]    Fax. [phone]  This document serves as a record of services personally performed by Lurline Del, MD. It was created on his behalf by Sheron Nightingale, a trained medical scribe. The creation of this record is based on the scribe's personal observations and the provider's statements to them.   I have reviewed the above documentation for accuracy and completeness, and I agree with the above.

## 2017-06-24 ENCOUNTER — Telehealth: Payer: Self-pay | Admitting: Oncology

## 2017-06-24 ENCOUNTER — Ambulatory Visit: Payer: Medicare Other | Admitting: Oncology

## 2017-06-24 VITALS — BP 169/72 | HR 96 | Temp 98.1°F | Resp 24 | Ht 66.5 in | Wt 227.6 lb

## 2017-06-24 DIAGNOSIS — Z170 Estrogen receptor positive status [ER+]: Secondary | ICD-10-CM | POA: Diagnosis not present

## 2017-06-24 DIAGNOSIS — C50411 Malignant neoplasm of upper-outer quadrant of right female breast: Secondary | ICD-10-CM

## 2017-06-24 DIAGNOSIS — Z79811 Long term (current) use of aromatase inhibitors: Secondary | ICD-10-CM

## 2017-06-24 MED ORDER — ANASTROZOLE 1 MG PO TABS: 1.0000 mg | ORAL_TABLET | Freq: Every day | ORAL | 4 refills | Status: DC

## 2017-06-24 NOTE — Telephone Encounter (Signed)
Gave patient AVS and calendar of upcoming October 2019 appointments.

## 2017-07-02 ENCOUNTER — Other Ambulatory Visit: Payer: Self-pay | Admitting: Oncology

## 2017-07-02 MED ORDER — ANASTROZOLE 1 MG PO TABS: 1.0000 mg | ORAL_TABLET | Freq: Every day | ORAL | 4 refills | Status: AC

## 2018-02-02 ENCOUNTER — Other Ambulatory Visit: Payer: Self-pay | Admitting: *Deleted

## 2018-03-25 ENCOUNTER — Telehealth: Payer: Self-pay | Admitting: Oncology

## 2018-03-25 NOTE — Telephone Encounter (Signed)
Faxing medical records to [facility]: [employer], Release ID: [id number]

## 2018-05-31 NOTE — Progress Notes (Addendum)
ID: Lori Randall OB: Feb 03, 1951  MR#: [id number]  TXM#:[id number]  Patient Care Team: Kelton Pillar, MD as PCP - General (Family Medicine) Fanny Skates, MD as Consulting Physician (General Surgery) Jaydence Arnesen, Virgie Dad, MD as Consulting Physician (Oncology)  CHIEF COMPLAINT: right breast cancer   CURRENT THERAPY: Completed 5 years of anastrozole   BREAST CANCER HISTORY: From the earlier summary:  "Lori Randall" had routine screening mammography at [facility] 01/11/2013 showing a potential abnormality in the left breast. Additional views 01/13/2013 found that the calcifications noted in the left breast were likely benign. However on the right a previously noted mass was now irregular in contour. There were also some associated calcifications. A right breast ultrasound found a hypoechoic mass collar than wide measuring 1.2 cm. Biopsy of this mass the same day ([id number]) showed an invasive ductal carcinoma, grade 1, estrogen receptor 100% positive, progesterone receptor 86% positive, with an MIB-1 of 14% and no HER-2 amplification.  Bilateral breast MRIs 01/25/2013 showed a 2.1 cm lobulated enhancing mass in the posterior third of the upper outer quadrant of the right breast there were no other areas of concern in either breast and no enlarged axillary or internal mammary adenopathy.  The patient's subsequent history is as detailed below   INTERVAL HISTORY: Lori Randall returns today for follow-up and treatment of her estrogen receptor positive breast cancer.  She stopped taking the anastrozole at the end of September, 2019. She notes that she has had fewer hot flashes. She has no real change with her aching joints at this time.    Since her last visit to the office, she completed a bone density scan on 03/03/2018 at [facility] that showed: T-score of -1.0.  This is normal  She also had a diagnostic bilateral mammogram on 03/03/2018 that showed: Breast density category B. Stable post-treatment changes of the  right breast. There is no mammographic evidence of malignancy.    REVIEW OF SYSTEMS: [name] reports that she is not completing much exercise at this time, however, she plays with her grandchildren at times. She denies unusual headaches, visual changes, nausea, vomiting, or dizziness. There has been no unusual cough, phlegm production, or pleurisy. This been no change in bowel or bladder habits. She denies unexplained fatigue or unexplained weight loss, bleeding, rash, or fever. A detailed review of systems was otherwise stable.    PAST MEDICAL HISTORY: Past Medical History:  Diagnosis Date  . Arthritis    knees & hands   . Breast cancer ([place]) 01/13/13   right breast bx=invasive ca ,tx with surgery & radiation   . Complication of anesthesia   . Headache    h/o of migraines   . Hx of radiation therapy 03/11/13-04/27/13  . Hypertension   . PONV (postoperative nausea and vomiting)    2014 after breast surgery  . Shortness of breath dyspnea    with exertion   . Sleep apnea    last study- 2010, Dr. Maxwell Caul , uses CPAP every night     PAST SURGICAL HISTORY: Past Surgical History:  Procedure Laterality Date  . APPENDECTOMY     age 62  . BREAST SURGERY    . COLONOSCOPY W/ POLYPECTOMY    . excisional biopsy right breast    . JOINT REPLACEMENT    . PARTIAL MASTECTOMY WITH NEEDLE LOCALIZATION AND AXILLARY SENTINEL LYMPH NODE BX Right 02/01/2013   Procedure: PARTIAL MASTECTOMY WITH NEEDLE LOCALIZATION AND AXILLARY SENTINEL LYMPH NODE BX;  Surgeon: Adin Hector, MD;  Location: [place];  Service: General;  Laterality: Right;  needle localization at 7:30 SOLIS nuclear medicine 30 minutes prior to surgery right breast 9:30  . TOTAL KNEE ARTHROPLASTY Right 08/31/2014   dr Mayer Camel  . TOTAL KNEE ARTHROPLASTY Right 08/31/2014   Procedure: RIGHT TOTAL KNEE ARTHROPLASTY;  Surgeon: Kerin Salen, MD;  Location: [place];  Service: Orthopedics;  Laterality: Right;  . TOTAL KNEE ARTHROPLASTY Left 08/14/2015    Procedure: TOTAL KNEE ARTHROPLASTY;  Surgeon: Frederik Pear, MD;  Location: [place];  Service: Orthopedics;  Laterality: Left;    FAMILY HISTORY No family history on file. The patient's father died at the age of 11 with congestive 4 Lovena Le in the setting of severe dementia. The patient's mother died at age 103 with atypical parkinsonism. The patient has 3 brothers and 2 sisters. There is no history of breast or ovarian cancer in the family  GYNECOLOGIC HISTORY:  Menarche age 64, first live birth age 2. The patient is GX P2. She went through menopause approximately 09-Oct-2002. She took hormone replacement approximately 2 years. She took birth control remotely for approximately 10 years, without complications.  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 Her husband Gearldine Shown") M. Surveyor, minerals used to work for [employer]. He is now retired. Daughter Mekesha Solomon is a paramedic in [employer]. Son Sharyl Panchal died in 10-09-14. The patient has 2 grandchild "and one on the way".. She attends a [employer]    ADVANCED DIRECTIVES: In place.   HEALTH MAINTENANCE: Social History   Tobacco Use  . Smoking status: Never Smoker  . Smokeless tobacco: Never Used  Substance Use Topics  . Alcohol use: No    Comment: maybe 1 glass of wine a month  . Drug use: No     Colonoscopy: 10-Oct-2003  PAP: November 2014  Bone density: 06/09/2013 at [facility]; normal  Lipid panel:  No Known Allergies  Current Outpatient Medications  Medication Sig Dispense Refill  . amoxicillin (AMOXIL) 500 MG capsule Take 1,000 mg by mouth See admin instructions. Take 2 capsules (1000 mg) by mouth 2 hours prior to dental appointment and 2 capsules 1 hour after dental appointment    . anastrozole (ARIMIDEX) 1 MG tablet Take 1 tablet (1 mg total) by mouth daily. 90 tablet 4  . atorvastatin (LIPITOR) 80 MG tablet Take 80 mg by mouth daily.    . hydrochlorothiazide (HYDRODIURIL) 25 MG  tablet Take 25 mg by mouth daily.    . Omega-3 Fatty Acids (FISH OIL) 1200 MG CAPS Take 1,200 mg by mouth daily.    Marland Kitchen PRESCRIPTION MEDICATION at bedtime. CPAP    . ramipril (ALTACE) 5 MG capsule Take 5 mg by mouth daily.     No current facility-administered medications for this visit.     OBJECTIVE: Middle-aged white woman who appears stated age  29:   06/01/18 1128  BP: (!) 157/81  Pulse: 71  Resp: 18  Temp: 98.4 F (36.9 C)  SpO2: 98%     Body mass index is 35.1 kg/m.    ECOG FS: 1  Sclerae unicteric, EOMs intact Oropharynx clear and moist No cervical or supraclavicular adenopathy Lungs no rales or rhonchi Heart regular rate and rhythm Abd soft, nontender, positive bowel sounds MSK no focal spinal tenderness, no upper extremity lymphedema Neuro: nonfocal, well oriented, appropriate affect Breasts: Right breast is status post lumpectomy and radiation.  There is no evidence of local recurrence.  The left breast is benign.  Both axillae are benign.  LAB RESULTS:  CMP     Component Value Date/Time   NA 142 06/17/2017 1236   K 3.6 06/17/2017 1236   CL 101 08/15/2015 0410   CL 105 01/27/2013 1208   CO2 27 06/17/2017 1236   GLUCOSE 105 06/17/2017 1236   GLUCOSE 123 (H) 01/27/2013 1208   BUN 11.0 06/17/2017 1236   CREATININE 0.7 06/17/2017 1236   CALCIUM 10.9 (H) 06/17/2017 1236   PROT 7.4 06/17/2017 1236   ALBUMIN 4.0 06/17/2017 1236   AST 30 06/17/2017 1236   ALT 43 06/17/2017 1236   ALKPHOS 126 06/17/2017 1236   BILITOT 0.57 06/17/2017 1236   GFRNONAA >60 08/15/2015 0410   GFRAA >60 08/15/2015 0410    I No results found for: SPEP  Lab Results  Component Value Date   WBC 3.6 (L) 06/01/2018   NEUTROABS 2.1 06/01/2018   HGB 13.3 06/01/2018   HCT 40.2 06/01/2018   MCV 92.6 06/01/2018   PLT 217 06/01/2018      Chemistry      Component Value Date/Time   NA 142 06/17/2017 1236   K 3.6 06/17/2017 1236   CL 101 08/15/2015 0410   CL 105 01/27/2013  1208   CO2 27 06/17/2017 1236   BUN 11.0 06/17/2017 1236   CREATININE 0.7 06/17/2017 1236      Component Value Date/Time   CALCIUM 10.9 (H) 06/17/2017 1236   ALKPHOS 126 06/17/2017 1236   AST 30 06/17/2017 1236   ALT 43 06/17/2017 1236   BILITOT 0.57 06/17/2017 1236       No results found for: LABCA2  No components found for: LABCA125  No results for input(s): INR in the last 168 hours.  Urinalysis    Component Value Date/Time   COLORURINE YELLOW 08/03/2015 1120    STUDIES: Since her last visit to the office, she completed a bone density scan on 03/03/2018 at [facility] that showed: T-score of -1.0.   She also had a diagnostic bilateral mammogram on 03/03/2018 that showed: Breast density category B. Stable post-treatment changes of the right breast. There is no mammographic evidence of malignancy.    ASSESSMENT: 67 y.o. [place], [place] woman status post right breast biopsy 01/13/2013 for a clinical T2 N0, stage IIA invasive ductal carcinoma, grade 1, estrogen receptor 100% positive, progesterone receptor 86% positive, with an MIB-1 of 14% and no HER-2 amplification  (1) status post right lumpectomy and sentinel lymph node dissection 02/01/2013 for a pT2 pN0, stage IIA invasive ductal carcinoma, grade 1, with repeat HER-2 negative and ample margins.  (2) Oncotype DX score of 13 predicts a risk of distant recurrence of 8% within 10 years if the patient's only systemic therapy is tamoxifen for 5 years. It also predicts no significant benefit from chemotherapy  (3) adjuvant radiation completed 04/27/2013  (4) started anastrozole 06/05/2013, completed 5 years September 2019  (a) bone density scan November 2014 was normal  (b) bone density scan 03/03/2018 was normal with a T score of -1.0  PLAN: Lori Randall is now a little over 5 years out from definitive surgery for her breast cancer with no evidence of disease recurrence.  This is very favorable.  She has completed 5 years of  anastrozole.  There is data for an additional 2 years and in patients were at high risk of recurrence, particularly node positive patients, that may add another 1-2% risk reduction.  In someone like Juliann Pulse I would anticipate no significant benefit and so we are  stopping at this point.  We also discussed her bone density which remains in the normal range.  At this point I feel comfortable releasing her to her primary care physician.  All she will need in terms of breast cancer follow-up is yearly mammography, preferably with tomography, and a yearly physician breast exam  I will be glad to see Juliann Pulse again at any point in the future if and when the need arises but as of now we are making no further routine appointments for her here.   Alvin Rubano, Virgie Dad, MD  06/01/18 11:42 AM Medical Oncology and Hematology [facility] [address] [place], [place] [address] Tel. [phone]    Fax. [phone]    I, Soijett Blue am acting as scribe for Dr. Sarajane Jews C. Neva Ramaswamy.  I, Lurline Del MD, have reviewed the above documentation for accuracy and completeness, and I agree with the above.   Addendum: Juliann Pulse has had a moderately persistent mild hypercalcemia.  A year ago when her calcium was 11.2 her PTH was normal at 50.  This does not suggest hyperparathyroidism.  Most likely the hypercalcemia is due to her hydrochlorothiazide.

## 2018-06-01 ENCOUNTER — Telehealth: Payer: Self-pay | Admitting: Oncology

## 2018-06-01 ENCOUNTER — Other Ambulatory Visit: Payer: Self-pay | Admitting: Oncology

## 2018-06-01 ENCOUNTER — Inpatient Hospital Stay: Payer: Medicare Other | Admitting: Oncology

## 2018-06-01 ENCOUNTER — Inpatient Hospital Stay: Payer: Medicare Other | Attending: Oncology

## 2018-06-01 VITALS — BP 157/81 | HR 71 | Temp 98.4°F | Resp 18 | Ht 66.5 in | Wt 220.8 lb

## 2018-06-01 DIAGNOSIS — Z170 Estrogen receptor positive status [ER+]: Secondary | ICD-10-CM

## 2018-06-01 DIAGNOSIS — Z853 Personal history of malignant neoplasm of breast: Secondary | ICD-10-CM | POA: Insufficient documentation

## 2018-06-01 DIAGNOSIS — C50411 Malignant neoplasm of upper-outer quadrant of right female breast: Secondary | ICD-10-CM

## 2018-06-01 DIAGNOSIS — Z923 Personal history of irradiation: Secondary | ICD-10-CM

## 2018-06-01 LAB — COMPREHENSIVE METABOLIC PANEL
ALBUMIN: 3.9 g/dL (ref 3.5–5.0)
ALT: 47 U/L — ABNORMAL HIGH (ref 0–44)
ANION GAP: 7 (ref 5–15)
AST: 30 U/L (ref 15–41)
Alkaline Phosphatase: 128 U/L — ABNORMAL HIGH (ref 38–126)
BUN: 11 mg/dL (ref 8–23)
CALCIUM: 10.8 mg/dL — AB (ref 8.9–10.3)
CHLORIDE: 108 mmol/L (ref 98–111)
CO2: 26 mmol/L (ref 22–32)
Creatinine, Ser: 0.69 mg/dL (ref 0.44–1.00)
GFR calc non Af Amer: >60 mL/min (ref >60)
GLUCOSE: 95 mg/dL (ref 70–99)
POTASSIUM: 4.1 mmol/L (ref 3.5–5.1)
SODIUM: 141 mmol/L (ref 135–145)
TOTAL PROTEIN: 7.1 g/dL (ref 6.5–8.1)
Total Bilirubin: 0.6 mg/dL (ref 0.3–1.2)

## 2018-06-01 LAB — CBC WITH DIFFERENTIAL/PLATELET
Abs Immature Granulocytes: 0.01 10*3/uL (ref 0.00–0.07)
BASOS ABS: 0 10*3/uL (ref 0.0–0.1)
BASOS PCT: 1 %
EOS PCT: 2 %
Eosinophils Absolute: 0.1 10*3/uL (ref 0.0–0.5)
HCT: 40.2 % (ref 36.0–46.0)
HEMOGLOBIN: 13.3 g/dL (ref 12.0–15.0)
Immature Granulocytes: 0 %
LYMPHS ABS: 1 10*3/uL (ref 0.7–4.0)
Lymphocytes Relative: 28 %
MCH: 30.6 pg (ref 26.0–34.0)
MCHC: 33.1 g/dL (ref 30.0–36.0)
MCV: 92.6 fL (ref 80.0–100.0)
MONO ABS: 0.4 10*3/uL (ref 0.1–1.0)
MONOS PCT: 10 %
NEUTROS ABS: 2.1 10*3/uL (ref 1.7–7.7)
NEUTROS PCT: 59 %
PLATELETS: 217 10*3/uL (ref 150–400)
RBC: 4.34 MIL/uL (ref 3.87–5.11)
RDW: 12.4 % (ref 11.5–15.5)
WBC: 3.6 10*3/uL — ABNORMAL LOW (ref 4.0–10.5)
nRBC: 0 % (ref 0.0–0.2)

## 2018-06-01 NOTE — Telephone Encounter (Signed)
Per 10/28 no los

## 2019-03-09 ENCOUNTER — Encounter: Payer: Self-pay | Admitting: Oncology

## 2020-08-16 DIAGNOSIS — G4733 Obstructive sleep apnea (adult) (pediatric): Secondary | ICD-10-CM | POA: Diagnosis not present

## 2020-08-23 DIAGNOSIS — Z1159 Encounter for screening for other viral diseases: Secondary | ICD-10-CM | POA: Diagnosis not present

## 2020-09-05 DIAGNOSIS — I10 Essential (primary) hypertension: Secondary | ICD-10-CM | POA: Diagnosis not present

## 2020-09-05 DIAGNOSIS — Z1389 Encounter for screening for other disorder: Secondary | ICD-10-CM | POA: Diagnosis not present

## 2020-09-05 DIAGNOSIS — Z853 Personal history of malignant neoplasm of breast: Secondary | ICD-10-CM | POA: Diagnosis not present

## 2020-09-05 DIAGNOSIS — G4733 Obstructive sleep apnea (adult) (pediatric): Secondary | ICD-10-CM | POA: Diagnosis not present

## 2020-09-05 DIAGNOSIS — Z0000 Encounter for general adult medical examination without abnormal findings: Secondary | ICD-10-CM | POA: Diagnosis not present

## 2020-09-05 DIAGNOSIS — E7800 Pure hypercholesterolemia, unspecified: Secondary | ICD-10-CM | POA: Diagnosis not present

## 2020-11-14 DIAGNOSIS — G4733 Obstructive sleep apnea (adult) (pediatric): Secondary | ICD-10-CM | POA: Diagnosis not present

## 2021-02-13 DIAGNOSIS — G4733 Obstructive sleep apnea (adult) (pediatric): Secondary | ICD-10-CM | POA: Diagnosis not present

## 2021-03-06 DIAGNOSIS — E7800 Pure hypercholesterolemia, unspecified: Secondary | ICD-10-CM | POA: Diagnosis not present

## 2021-03-06 DIAGNOSIS — I10 Essential (primary) hypertension: Secondary | ICD-10-CM | POA: Diagnosis not present

## 2021-03-13 DIAGNOSIS — Z1231 Encounter for screening mammogram for malignant neoplasm of breast: Secondary | ICD-10-CM | POA: Diagnosis not present

## 2021-05-16 DIAGNOSIS — G4733 Obstructive sleep apnea (adult) (pediatric): Secondary | ICD-10-CM | POA: Diagnosis not present

## 2021-05-21 DIAGNOSIS — H2513 Age-related nuclear cataract, bilateral: Secondary | ICD-10-CM | POA: Diagnosis not present

## 2021-05-21 DIAGNOSIS — H35013 Changes in retinal vascular appearance, bilateral: Secondary | ICD-10-CM | POA: Diagnosis not present

## 2021-05-21 DIAGNOSIS — H04123 Dry eye syndrome of bilateral lacrimal glands: Secondary | ICD-10-CM | POA: Diagnosis not present

## 2021-05-21 DIAGNOSIS — H524 Presbyopia: Secondary | ICD-10-CM | POA: Diagnosis not present

## 2021-05-21 DIAGNOSIS — H43393 Other vitreous opacities, bilateral: Secondary | ICD-10-CM | POA: Diagnosis not present

## 2021-05-21 DIAGNOSIS — H5203 Hypermetropia, bilateral: Secondary | ICD-10-CM | POA: Diagnosis not present

## 2021-08-16 DIAGNOSIS — G4733 Obstructive sleep apnea (adult) (pediatric): Secondary | ICD-10-CM | POA: Diagnosis not present

## 2021-09-13 DIAGNOSIS — E7800 Pure hypercholesterolemia, unspecified: Secondary | ICD-10-CM | POA: Diagnosis not present

## 2021-09-13 DIAGNOSIS — Z86010 Personal history of colonic polyps: Secondary | ICD-10-CM | POA: Diagnosis not present

## 2021-09-13 DIAGNOSIS — Z0000 Encounter for general adult medical examination without abnormal findings: Secondary | ICD-10-CM | POA: Diagnosis not present

## 2021-09-13 DIAGNOSIS — C50911 Malignant neoplasm of unspecified site of right female breast: Secondary | ICD-10-CM | POA: Diagnosis not present

## 2021-09-13 DIAGNOSIS — Z1389 Encounter for screening for other disorder: Secondary | ICD-10-CM | POA: Diagnosis not present

## 2021-09-13 DIAGNOSIS — G4733 Obstructive sleep apnea (adult) (pediatric): Secondary | ICD-10-CM | POA: Diagnosis not present

## 2021-09-13 DIAGNOSIS — Z853 Personal history of malignant neoplasm of breast: Secondary | ICD-10-CM | POA: Diagnosis not present

## 2021-09-13 DIAGNOSIS — M1990 Unspecified osteoarthritis, unspecified site: Secondary | ICD-10-CM | POA: Diagnosis not present

## 2021-09-13 DIAGNOSIS — I10 Essential (primary) hypertension: Secondary | ICD-10-CM | POA: Diagnosis not present

## 2021-10-08 DIAGNOSIS — G4733 Obstructive sleep apnea (adult) (pediatric): Secondary | ICD-10-CM | POA: Diagnosis not present

## 2021-11-14 DIAGNOSIS — G4733 Obstructive sleep apnea (adult) (pediatric): Secondary | ICD-10-CM | POA: Diagnosis not present

## 2021-11-22 DIAGNOSIS — I10 Essential (primary) hypertension: Secondary | ICD-10-CM | POA: Diagnosis not present

## 2021-11-22 DIAGNOSIS — J40 Bronchitis, not specified as acute or chronic: Secondary | ICD-10-CM | POA: Diagnosis not present

## 2021-11-22 DIAGNOSIS — H60502 Unspecified acute noninfective otitis externa, left ear: Secondary | ICD-10-CM | POA: Diagnosis not present

## 2021-11-28 DIAGNOSIS — Z1231 Encounter for screening mammogram for malignant neoplasm of breast: Secondary | ICD-10-CM | POA: Diagnosis not present

## 2022-02-13 DIAGNOSIS — G4733 Obstructive sleep apnea (adult) (pediatric): Secondary | ICD-10-CM | POA: Diagnosis not present

## 2022-03-14 DIAGNOSIS — Z1231 Encounter for screening mammogram for malignant neoplasm of breast: Secondary | ICD-10-CM | POA: Diagnosis not present

## 2022-04-16 DIAGNOSIS — Z780 Asymptomatic menopausal state: Secondary | ICD-10-CM | POA: Diagnosis not present

## 2022-04-16 DIAGNOSIS — M85852 Other specified disorders of bone density and structure, left thigh: Secondary | ICD-10-CM | POA: Diagnosis not present

## 2022-04-29 DIAGNOSIS — Z23 Encounter for immunization: Secondary | ICD-10-CM | POA: Diagnosis not present

## 2022-05-16 DIAGNOSIS — G4733 Obstructive sleep apnea (adult) (pediatric): Secondary | ICD-10-CM | POA: Diagnosis not present

## 2022-06-17 DIAGNOSIS — L309 Dermatitis, unspecified: Secondary | ICD-10-CM | POA: Diagnosis not present

## 2022-06-17 DIAGNOSIS — K130 Diseases of lips: Secondary | ICD-10-CM | POA: Diagnosis not present
